# Patient Record
Sex: Female | Born: 1985 | Hispanic: Yes | Marital: Married | Smoking: Never smoker
Health system: Southern US, Community
[De-identification: ages and names within clinical notes are randomized; demographics above are authoritative.]

## PROBLEM LIST (undated history)

## (undated) DIAGNOSIS — R42 Dizziness and giddiness: Secondary | ICD-10-CM

## (undated) DIAGNOSIS — R87629 Unspecified abnormal cytological findings in specimens from vagina: Secondary | ICD-10-CM

## (undated) DIAGNOSIS — J310 Chronic rhinitis: Secondary | ICD-10-CM

## (undated) HISTORY — DX: Dizziness and giddiness: R42

---

## 2004-02-25 ENCOUNTER — Emergency Department (HOSPITAL_COMMUNITY): Admission: EM | Admit: 2004-02-25 | Discharge: 2004-02-25 | Payer: Self-pay | Admitting: *Deleted

## 2008-11-07 ENCOUNTER — Ambulatory Visit (HOSPITAL_COMMUNITY): Admission: RE | Admit: 2008-11-07 | Discharge: 2008-11-07 | Payer: Self-pay | Admitting: Family Medicine

## 2009-01-14 ENCOUNTER — Encounter: Admission: RE | Admit: 2009-01-14 | Discharge: 2009-01-14 | Payer: Self-pay | Admitting: Obstetrics and Gynecology

## 2009-06-16 ENCOUNTER — Inpatient Hospital Stay (HOSPITAL_COMMUNITY): Admission: AD | Admit: 2009-06-16 | Discharge: 2009-06-19 | Payer: Self-pay | Admitting: Obstetrics and Gynecology

## 2009-06-16 ENCOUNTER — Encounter (HOSPITAL_COMMUNITY): Payer: Self-pay | Admitting: Obstetrics and Gynecology

## 2009-06-20 ENCOUNTER — Encounter: Admission: RE | Admit: 2009-06-20 | Discharge: 2009-07-12 | Payer: Self-pay | Admitting: Obstetrics and Gynecology

## 2010-02-21 IMAGING — US US OB COMP LESS 14 WK
1 series · 14 of 28 positions shown · non-contrast
Comparison: None

CLINICAL DATA: Early pregnancy, uncertain dates

OBSTETRIC <14 WK US AND TRANSVAGINAL OB US
TECHNIQUE: Both transabdominal and transvaginal ultrasound
examinations were performed for complete evaluation of the
gestation as well as the maternal uterus, adnexal regions, and
pelvic cul-de-sac.

[Series 1: us ob comp less 14 wk · 0.18mm/px · 54 acquisitions, 14 frames shown]
[im 2/54]
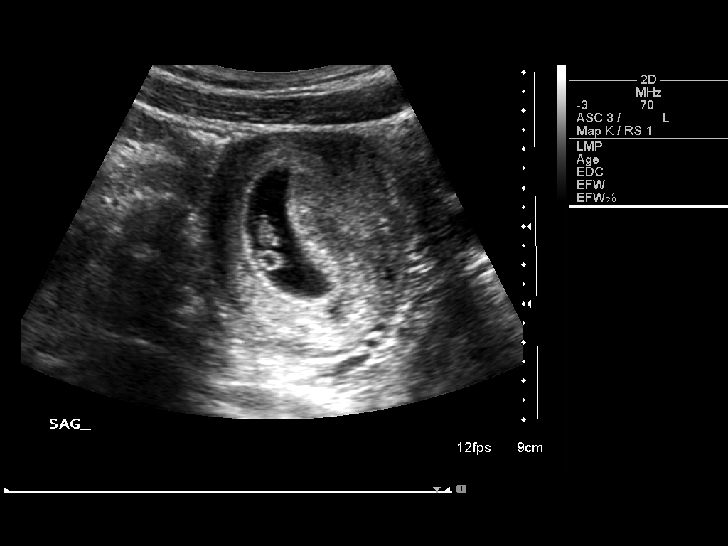
[im 6/54]
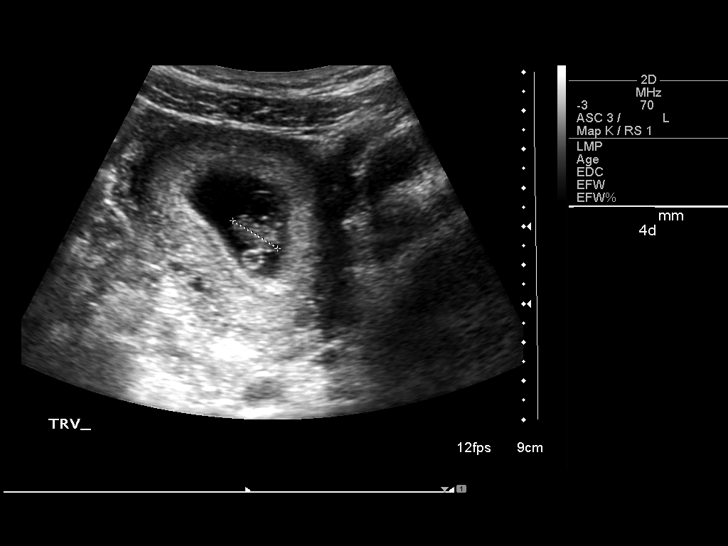
[im 10/54]
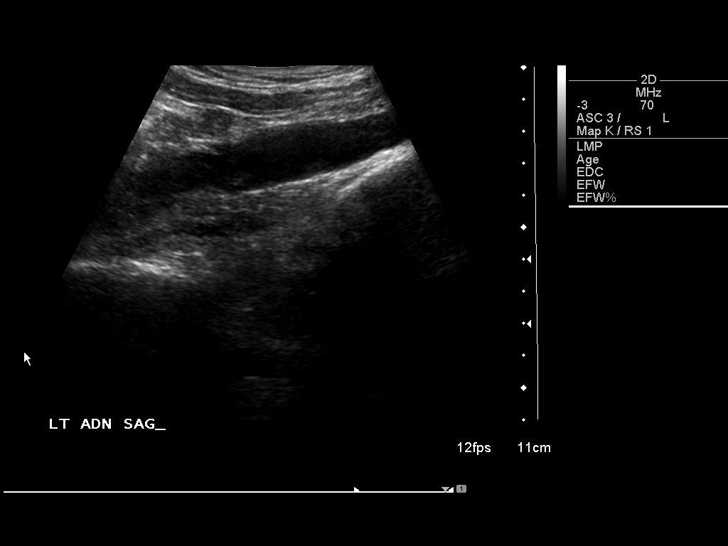
[im 14/54]
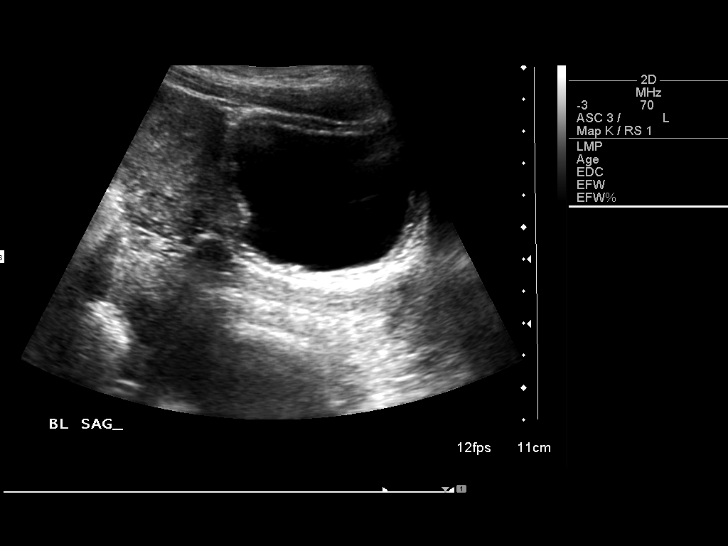
[im 18/54]
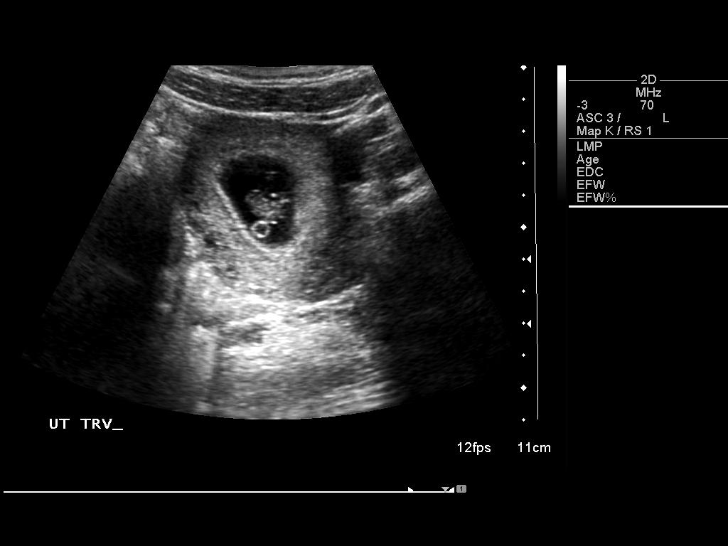
[im 22/54]
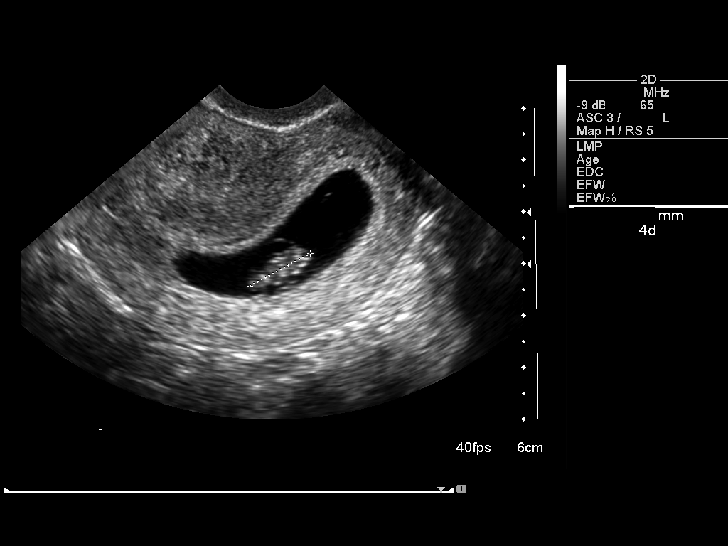
[im 26/54]
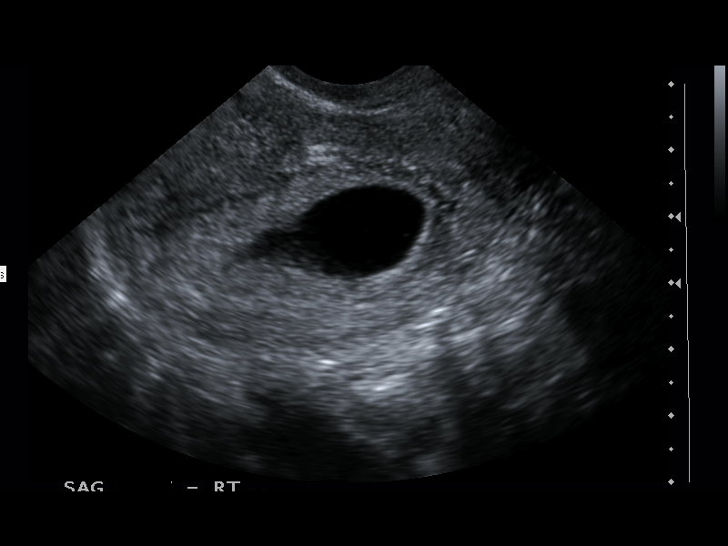
[im 30/54]
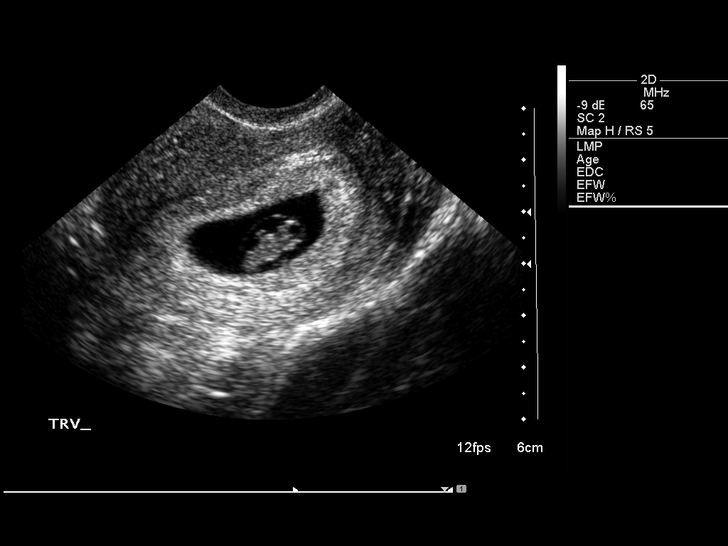
[im 34/54]
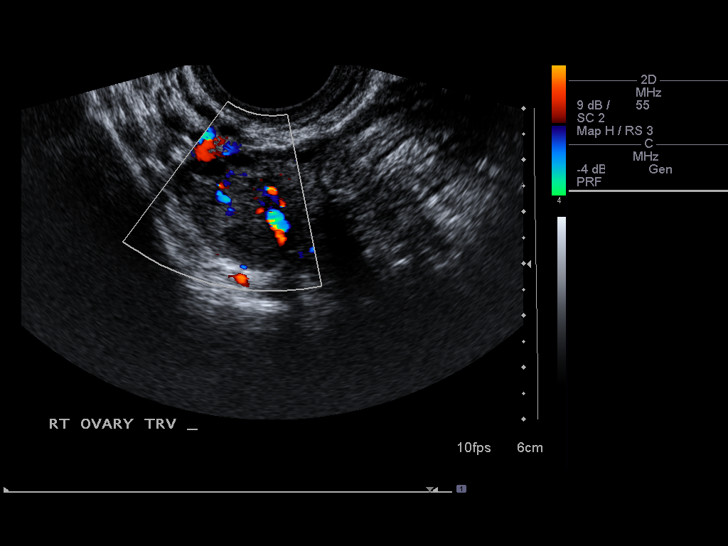
[im 38/54]
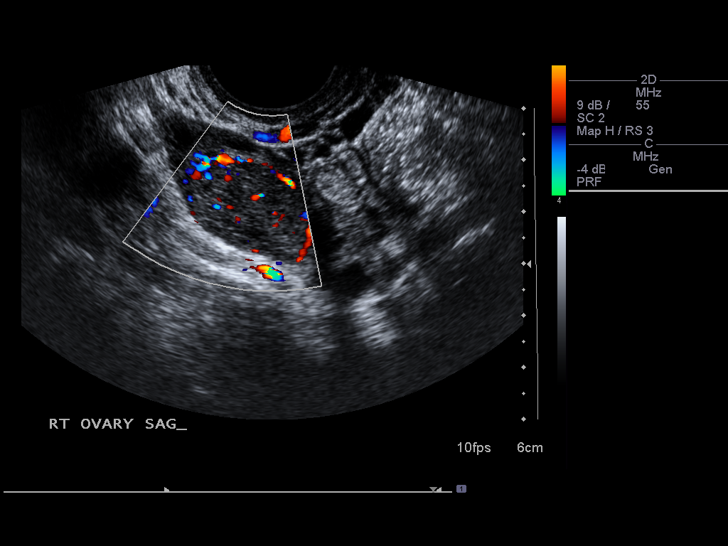
[im 42/54]
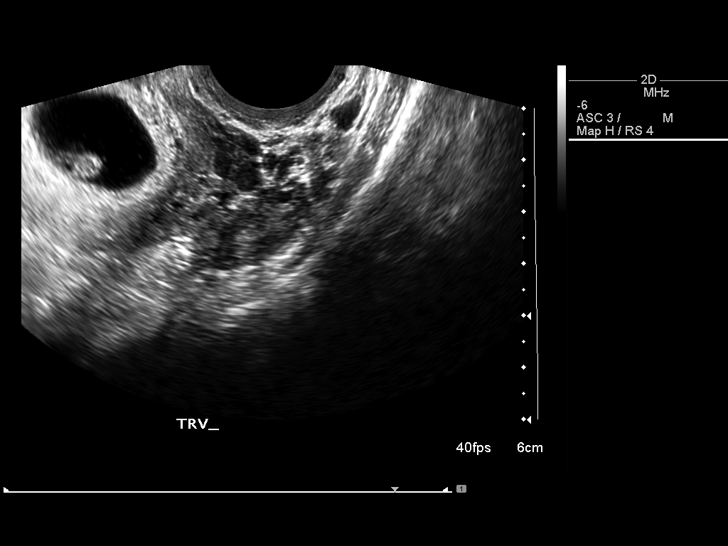
[im 46/54]
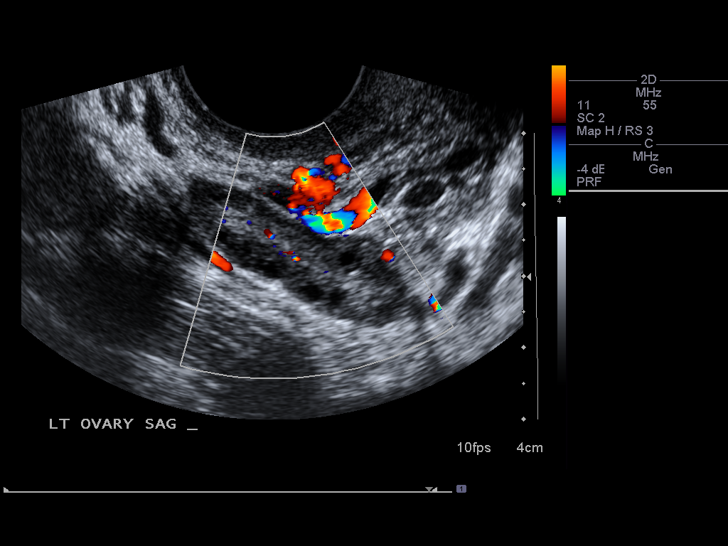
[im 50/54]
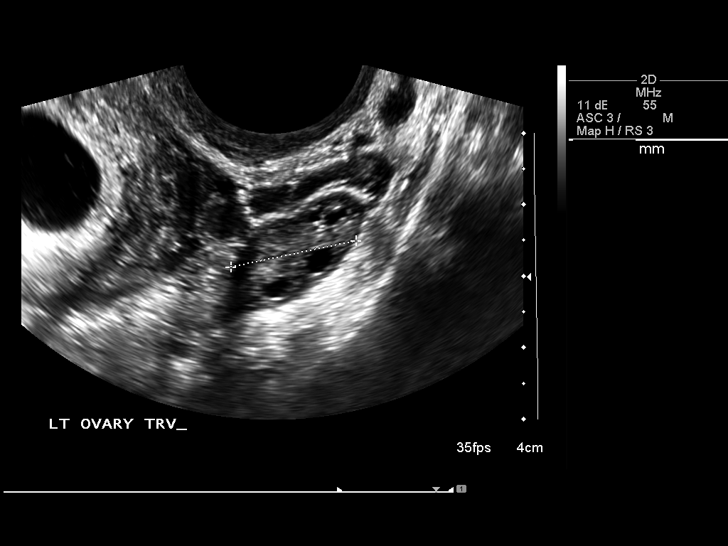
[im 54/54]
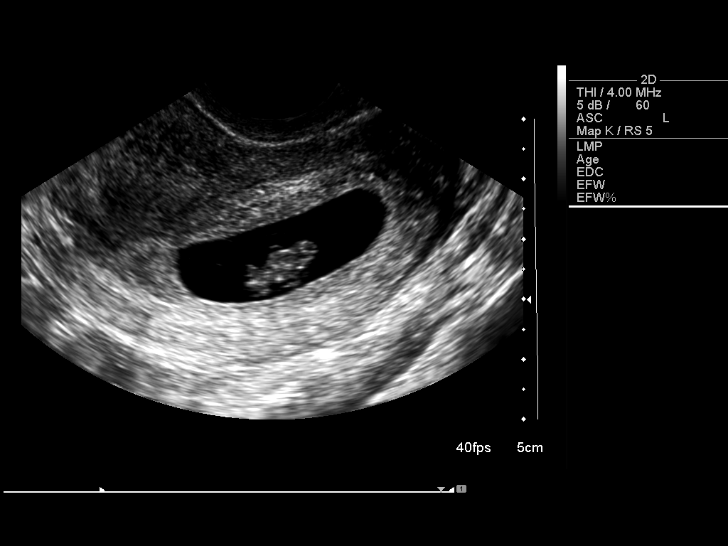

[14 of 28 positions shown; findings below may reference images not displayed]

Intrauterine gestational sac: Present
Yolk sac: Present
Embryo: Present
Cardiac Activity: Present
Heart Rate: 160 bpm

CRL: 12.7 mm           7   w  3   d             US EDC: 06/23/2009

Maternal uterus/adnexae:
No subchorionic hemorrhage.
Left ovary normal size and morphology, 3.5 x 1.4 x 1.8 cm.
Right ovary measures 3.9 x 2.0 x 2.7 cm contains a small
hemorrhagic corpus luteum.
No adnexal masses or free pelvic fluid.
IMPRESSION: Single live early intrauterine gestation at 7 weeks 3 days EGA.
No acute abnormalities.

## 2010-11-05 LAB — CBC
HCT: 27.9 % — ABNORMAL LOW (ref 36.0–46.0)
MCHC: 33.7 g/dL (ref 30.0–36.0)
Platelets: 173 10*3/uL (ref 150–400)
Platelets: 219 10*3/uL (ref 150–400)
RBC: 3.76 MIL/uL — ABNORMAL LOW (ref 3.87–5.11)
RDW: 13.9 % (ref 11.5–15.5)
WBC: 12 10*3/uL — ABNORMAL HIGH (ref 4.0–10.5)

## 2010-11-05 LAB — RPR: RPR Ser Ql: NONREACTIVE

## 2011-03-27 LAB — RPR: RPR: NONREACTIVE

## 2011-03-27 LAB — RUBELLA ANTIBODY, IGM: Rubella: IMMUNE

## 2011-03-27 LAB — HEPATITIS B SURFACE ANTIGEN: Hepatitis B Surface Ag: NEGATIVE

## 2011-09-22 ENCOUNTER — Encounter (HOSPITAL_COMMUNITY): Payer: Self-pay

## 2011-09-22 ENCOUNTER — Inpatient Hospital Stay (HOSPITAL_COMMUNITY)
Admission: AD | Admit: 2011-09-22 | Discharge: 2011-09-22 | Disposition: A | Discharge status: Home / Self Care | Payer: BC Managed Care – PPO | Source: Ambulatory Visit | Attending: Obstetrics and Gynecology | Admitting: Obstetrics and Gynecology

## 2011-09-22 DIAGNOSIS — O99891 Other specified diseases and conditions complicating pregnancy: Secondary | ICD-10-CM | POA: Insufficient documentation

## 2011-09-22 DIAGNOSIS — E860 Dehydration: Secondary | ICD-10-CM | POA: Insufficient documentation

## 2011-09-22 DIAGNOSIS — K529 Noninfective gastroenteritis and colitis, unspecified: Secondary | ICD-10-CM

## 2011-09-22 DIAGNOSIS — K5289 Other specified noninfective gastroenteritis and colitis: Secondary | ICD-10-CM | POA: Insufficient documentation

## 2011-09-22 DIAGNOSIS — O212 Late vomiting of pregnancy: Secondary | ICD-10-CM | POA: Insufficient documentation

## 2011-09-22 DIAGNOSIS — R197 Diarrhea, unspecified: Secondary | ICD-10-CM | POA: Insufficient documentation

## 2011-09-22 LAB — URINE MICROSCOPIC-ADD ON

## 2011-09-22 LAB — URINALYSIS, ROUTINE W REFLEX MICROSCOPIC
Nitrite: NEGATIVE
Specific Gravity, Urine: >1.03 — ABNORMAL HIGH (ref 1.005–1.030)
Urobilinogen, UA: 0.2 mg/dL (ref 0.0–1.0)
pH: 6 (ref 5.0–8.0)

## 2011-09-22 MED ORDER — ONDANSETRON 4 MG PO TBDP: 4.0000 mg | ORAL_TABLET | Freq: Once | ORAL | Status: DC

## 2011-09-22 MED ORDER — ONDANSETRON HCL 4 MG/2ML IJ SOLN: 4.0000 mg | Freq: Once | INTRAMUSCULAR | Status: DC

## 2011-09-22 MED ORDER — DEXTROSE 5 % IN LACTATED RINGERS IV BOLUS
1000.0000 mL | Freq: Once | INTRAVENOUS | Status: AC
  Administered 2011-09-22: 1000 mL via INTRAVENOUS

## 2011-09-22 NOTE — ED Provider Notes (Signed)
History     Chief Complaint  Patient presents with  . Diarrhea  . Emesis   HPI This is a 26 year old G2 P1 001 at 37 weeks and 2 days who is a patient of [facility] who comes the MAU with nausea, vomiting, diarrhea that started this morning. She's had 2 episodes of vomiting and diarrhea. She does have some abdominal discomfort, mainly located in her right upper quadrant. Emesis consists of stomach contents and mild bile. She does have occasional intermittent contractions, but is not uncomfortable with these contractions. She denies decreased fetal activity, vaginal bleeding, vaginal discharge. She also denies fevers, myalgias, arthralgias, headaches. She does have sick contacts the forms of her husband and her sister who were sent with the same symptoms today's ago. They're both resolved at this point.    OB History    Grav Para Term Preterm Abortions TAB SAB Ect Mult Living   2 1 1  0 0 0 0 0 0 1      Past Medical History  Diagnosis Date  . No pertinent past medical history     Past Surgical History  Procedure Date  . Cesarean section     Family History  Problem Relation Age of Onset  . Anesthesia problems Neg Hx     History  Substance Use Topics  . Smoking status: Never Smoker   . Smokeless tobacco: Not on file  . Alcohol Use: No    Allergies: No Known Allergies  Prescriptions prior to admission  Medication Sig Dispense Refill  . guaifenesin (ROBITUSSIN) 100 MG/5ML syrup Take 200 mg by mouth 3 (three) times daily as needed. For cough.      . Prenatal Vit-Fe Fumarate-FA (PRENATAL MULTIVITAMIN) TABS Take 1 tablet by mouth daily.        ROS Physical Exam   Blood pressure 113/72, pulse 93, temperature 99 F (37.2 C), temperature source Oral, resp. rate 16, height 5\' 7"  (1.702 m), weight 84.278 kg (185 lb 12.8 oz), SpO2 99.00%.  Physical Exam  Constitutional: She is oriented to person, place, and time. She appears well-developed and well-nourished.    Respiratory: Effort normal.  GI: Soft. Bowel sounds are normal. She exhibits no distension and no mass. There is tenderness. There is no rebound and no guarding.  Neurological: She is alert and oriented to person, place, and time.  Skin: Skin is warm and dry.  Psychiatric: She has a normal mood and affect. Her behavior is normal. Judgment and thought content normal.   NST shows category 1 tracing with a baseline rate of 140. Contractions are every 5-10 minutes. Urinalysis shows: Specific gravity of 1.030 with ketones of 15.  MAU Course  Procedures  MDM Patient given 1 L bolus of D5 LR.  She was offered antiemetics, but they were declined.  After observation for 2 hours, patient related that she was feeling somewhat improved.  Contractions spaced out to approximately every 15 minutes.  Assessment and Plan  1.  Gastroenteritis 2.  Dehydration 3.  IUP at 37 weeks 2 days.  Patient discharged to home.  F/u tomorrow at [facility].  BRAT diet handout given.  Encouraged sips of fluids to keep hydrated.    Jeneen Doutt JEHIEL 09/22/2011, 10:44 AM

## 2011-09-22 NOTE — Progress Notes (Signed)
Patient states she started having nausea, vomiting and diarrhea this am. Has been having upper abdominal cramping. Reports no bleeding or leaking and good fetal movement.

## 2011-09-22 NOTE — Progress Notes (Signed)
Pt in c/o nausea, vomiting, diarrhea since this morning.  Reports vomiting x2 episodes, and diarrhea x2 days.  States daughter and husband have both had the same thing recently.  Reports generalized abdominal pain.  Denies any bleeding or lof.  + FM.

## 2011-09-22 NOTE — Discharge Instructions (Signed)
B.R.A.T. Diet    Your doctor has recommended the B.R.A.T. diet for you or your child until the condition improves. This is often used to help control diarrhea and vomiting symptoms. If you or your child can tolerate clear liquids, you may have:  · Bananas.   · Rice.   · Applesauce.   · Toast (and other simple starches such as crackers, potatoes, noodles).   Be sure to avoid dairy products, meats, and fatty foods until symptoms are better. Fruit juices such as apple, grape, and prune juice can make diarrhea worse. Avoid these. Continue this diet for 2 days or as instructed by your caregiver.  Document Released: 07/20/2005 Document Revised: 04/01/2011 Document Reviewed: 01/06/2007  [employer]® Patient Information ©2012 [employer].

## 2011-09-29 ENCOUNTER — Encounter (HOSPITAL_COMMUNITY): Payer: Self-pay

## 2011-09-30 ENCOUNTER — Encounter (HOSPITAL_COMMUNITY)
Admission: RE | Admit: 2011-09-30 | Discharge: 2011-09-30 | Disposition: A | Discharge status: Home / Self Care | Payer: BC Managed Care – PPO | Source: Ambulatory Visit | Attending: Obstetrics and Gynecology | Admitting: Obstetrics and Gynecology

## 2011-09-30 ENCOUNTER — Encounter (HOSPITAL_COMMUNITY): Payer: Self-pay

## 2011-09-30 HISTORY — DX: Chronic rhinitis: J31.0

## 2011-09-30 LAB — RPR: RPR Ser Ql: NONREACTIVE

## 2011-09-30 LAB — CBC
Hemoglobin: 11.2 g/dL — ABNORMAL LOW (ref 12.0–15.0)
Platelets: 244 10*3/uL (ref 150–400)
RBC: 3.99 MIL/uL (ref 3.87–5.11)

## 2011-09-30 LAB — SURGICAL PCR SCREEN: MRSA, PCR: NEGATIVE

## 2011-09-30 NOTE — Patient Instructions (Addendum)
   Your procedure is scheduled OZ:HYQMVH March 4th  Enter through the Main Entrance of [facility] at: 11:30am Pick up the phone at the desk and dial [phone] and inform us of your arrival.  Please call this number if you have any problems the morning of surgery: [phone]  Remember: Do not eat food after midnight:Sunday Do not drink clear liquids after:Monday at 9am Take these medicines the morning of surgery with a SIP OF WATER:none  Do not wear jewelry, make-up, or FINGER nail polish Do not wear lotions, powders, perfumes or deodorant. Do not shave 48 hours prior to surgery. Do not bring valuables to the hospital.  Leave suitcase in the car. After Surgery it may be brought to your room. For patients being admitted to the hospital, checkout time is 11:00am the day of discharge.     Remember to use your hibiclens as instructed.Please shower with 1/2 bottle the evening before your surgery and the other 1/2 bottle the morning of surgery.

## 2011-10-04 ENCOUNTER — Encounter (HOSPITAL_COMMUNITY): Payer: Self-pay

## 2011-10-04 ENCOUNTER — Inpatient Hospital Stay (HOSPITAL_COMMUNITY): Payer: BC Managed Care – PPO | Admitting: Anesthesiology

## 2011-10-04 ENCOUNTER — Inpatient Hospital Stay (HOSPITAL_COMMUNITY)
Admission: AD | Admit: 2011-10-04 | Discharge: 2011-10-06 | DRG: 371 | Disposition: A | Discharge status: Home / Self Care | Payer: BC Managed Care – PPO | Source: Ambulatory Visit | Attending: Obstetrics and Gynecology | Admitting: Obstetrics and Gynecology

## 2011-10-04 ENCOUNTER — Encounter (HOSPITAL_COMMUNITY)
Admission: AD | Disposition: A | Discharge status: Home / Self Care | Payer: Self-pay | Source: Ambulatory Visit | Attending: Obstetrics and Gynecology

## 2011-10-04 ENCOUNTER — Encounter (HOSPITAL_COMMUNITY): Payer: Self-pay | Admitting: Anesthesiology

## 2011-10-04 ENCOUNTER — Encounter (HOSPITAL_COMMUNITY): Payer: Self-pay | Admitting: *Deleted

## 2011-10-04 DIAGNOSIS — O34219 Maternal care for unspecified type scar from previous cesarean delivery: Principal | ICD-10-CM | POA: Diagnosis present

## 2011-10-04 DIAGNOSIS — Z01818 Encounter for other preprocedural examination: Secondary | ICD-10-CM

## 2011-10-04 DIAGNOSIS — Z01812 Encounter for preprocedural laboratory examination: Secondary | ICD-10-CM

## 2011-10-04 LAB — CBC
MCH: 28.6 pg (ref 26.0–34.0)
MCV: 87.7 fL (ref 78.0–100.0)
Platelets: 260 10*3/uL (ref 150–400)
RBC: 3.98 MIL/uL (ref 3.87–5.11)
RDW: 14.3 % (ref 11.5–15.5)
WBC: 11.4 10*3/uL — ABNORMAL HIGH (ref 4.0–10.5)

## 2011-10-04 LAB — RPR: RPR Ser Ql: NONREACTIVE

## 2011-10-04 SURGERY — Surgical Case
Anesthesia: Spinal | Site: Abdomen | Wound class: Clean Contaminated

## 2011-10-04 MED ORDER — SENNOSIDES-DOCUSATE SODIUM 8.6-50 MG PO TABS
2.0000 | ORAL_TABLET | Freq: Every day | ORAL | Status: DC
  Administered 2011-10-04 – 2011-10-05 (×2): 2 via ORAL

## 2011-10-04 MED ORDER — CITRIC ACID-SODIUM CITRATE 334-500 MG/5ML PO SOLN: 30.0000 mL | Freq: Once | ORAL | Status: DC

## 2011-10-04 MED ORDER — CITRIC ACID-SODIUM CITRATE 334-500 MG/5ML PO SOLN
30.0000 mL | Freq: Once | ORAL | Status: AC
  Administered 2011-10-04: 30 mL via ORAL

## 2011-10-04 MED ORDER — NALBUPHINE HCL 10 MG/ML IJ SOLN
5.0000 mg | INTRAMUSCULAR | Status: DC | PRN
  Filled 2011-10-04: qty 1

## 2011-10-04 MED ORDER — ONDANSETRON HCL 4 MG PO TABS: 4.0000 mg | ORAL_TABLET | ORAL | Status: DC | PRN

## 2011-10-04 MED ORDER — FENTANYL CITRATE 0.05 MG/ML IJ SOLN: 25.0000 ug | INTRAMUSCULAR | Status: DC | PRN

## 2011-10-04 MED ORDER — ONDANSETRON HCL 4 MG/2ML IJ SOLN
INTRAMUSCULAR | Status: AC
  Filled 2011-10-04: qty 2

## 2011-10-04 MED ORDER — SCOPOLAMINE 1 MG/3DAYS TD PT72
1.0000 | MEDICATED_PATCH | Freq: Once | TRANSDERMAL | Status: DC
  Administered 2011-10-04: 1.5 mg via TRANSDERMAL

## 2011-10-04 MED ORDER — OXYTOCIN 10 UNIT/ML IJ SOLN
INTRAMUSCULAR | Status: AC
  Filled 2011-10-04: qty 4

## 2011-10-04 MED ORDER — MENTHOL 3 MG MT LOZG: 1.0000 | LOZENGE | OROMUCOSAL | Status: DC | PRN

## 2011-10-04 MED ORDER — DIPHENHYDRAMINE HCL 25 MG PO CAPS: 25.0000 mg | ORAL_CAPSULE | ORAL | Status: DC | PRN

## 2011-10-04 MED ORDER — FENTANYL CITRATE 0.05 MG/ML IJ SOLN
INTRAMUSCULAR | Status: DC | PRN
  Administered 2011-10-04: 25 ug via INTRATHECAL

## 2011-10-04 MED ORDER — DIBUCAINE 1 % RE OINT: 1.0000 "application " | TOPICAL_OINTMENT | RECTAL | Status: DC | PRN

## 2011-10-04 MED ORDER — MEPERIDINE HCL 25 MG/ML IJ SOLN: 6.2500 mg | INTRAMUSCULAR | Status: DC | PRN

## 2011-10-04 MED ORDER — KETOROLAC TROMETHAMINE 30 MG/ML IJ SOLN
30.0000 mg | Freq: Four times a day (QID) | INTRAMUSCULAR | Status: AC | PRN
  Administered 2011-10-04: 30 mg via INTRAVENOUS

## 2011-10-04 MED ORDER — FAMOTIDINE IN NACL 20-0.9 MG/50ML-% IV SOLN
20.0000 mg | Freq: Once | INTRAVENOUS | Status: AC
  Administered 2011-10-04: 20 mg via INTRAVENOUS

## 2011-10-04 MED ORDER — ONDANSETRON HCL 4 MG/2ML IJ SOLN: 4.0000 mg | INTRAMUSCULAR | Status: DC | PRN

## 2011-10-04 MED ORDER — OXYTOCIN 10 UNIT/ML IJ SOLN
20.0000 [IU] | INTRAVENOUS | Status: DC | PRN
  Administered 2011-10-04: 20 [IU] via INTRAVENOUS

## 2011-10-04 MED ORDER — METOCLOPRAMIDE HCL 5 MG/ML IJ SOLN: 10.0000 mg | Freq: Once | INTRAMUSCULAR | Status: DC | PRN

## 2011-10-04 MED ORDER — ONDANSETRON HCL 4 MG/2ML IJ SOLN: 4.0000 mg | Freq: Three times a day (TID) | INTRAMUSCULAR | Status: DC | PRN

## 2011-10-04 MED ORDER — SODIUM CHLORIDE 0.9 % IV SOLN
1.0000 ug/kg/h | INTRAVENOUS | Status: DC | PRN
  Filled 2011-10-04: qty 2.5

## 2011-10-04 MED ORDER — WITCH HAZEL-GLYCERIN EX PADS: 1.0000 "application " | MEDICATED_PAD | CUTANEOUS | Status: DC | PRN

## 2011-10-04 MED ORDER — METOCLOPRAMIDE HCL 5 MG/ML IJ SOLN: 10.0000 mg | Freq: Three times a day (TID) | INTRAMUSCULAR | Status: DC | PRN

## 2011-10-04 MED ORDER — LACTATED RINGERS IV SOLN
INTRAVENOUS | Status: DC | PRN
  Administered 2011-10-04 (×3): via INTRAVENOUS

## 2011-10-04 MED ORDER — FENTANYL CITRATE 0.05 MG/ML IJ SOLN
INTRAMUSCULAR | Status: AC
  Filled 2011-10-04: qty 2

## 2011-10-04 MED ORDER — BUPIVACAINE IN DEXTROSE 0.75-8.25 % IT SOLN
INTRATHECAL | Status: DC | PRN
  Administered 2011-10-04: 1.8 mL via INTRATHECAL

## 2011-10-04 MED ORDER — EPHEDRINE 5 MG/ML INJ
INTRAVENOUS | Status: AC
  Filled 2011-10-04: qty 10

## 2011-10-04 MED ORDER — LACTATED RINGERS IV SOLN
INTRAVENOUS | Status: DC
  Administered 2011-10-04: 03:00:00 via INTRAVENOUS

## 2011-10-04 MED ORDER — PRENATAL MULTIVITAMIN CH
1.0000 | ORAL_TABLET | Freq: Every day | ORAL | Status: DC
  Administered 2011-10-06: 1 via ORAL
  Filled 2011-10-04 (×2): qty 1

## 2011-10-04 MED ORDER — MEPERIDINE HCL 25 MG/ML IJ SOLN
INTRAMUSCULAR | Status: DC | PRN
  Administered 2011-10-04: 25 mg via INTRAVENOUS

## 2011-10-04 MED ORDER — CEFAZOLIN SODIUM 1-5 GM-% IV SOLN
1.0000 g | Freq: Once | INTRAVENOUS | Status: DC
Start: 2011-10-04 — End: 2011-10-04
  Administered 2011-10-04: 1 g via INTRAVENOUS
  Filled 2011-10-04: qty 50

## 2011-10-04 MED ORDER — LACTATED RINGERS IV SOLN: INTRAVENOUS | Status: DC

## 2011-10-04 MED ORDER — SODIUM CHLORIDE 0.9 % IJ SOLN: 3.0000 mL | INTRAMUSCULAR | Status: DC | PRN

## 2011-10-04 MED ORDER — OXYTOCIN 10 UNIT/ML IJ SOLN
INTRAMUSCULAR | Status: AC
  Filled 2011-10-04: qty 2

## 2011-10-04 MED ORDER — MORPHINE SULFATE (PF) 0.5 MG/ML IJ SOLN
INTRAMUSCULAR | Status: DC | PRN
  Administered 2011-10-04: .15 mg via INTRATHECAL

## 2011-10-04 MED ORDER — LACTATED RINGERS IV SOLN
INTRAVENOUS | Status: DC
  Administered 2011-10-04: 19:00:00 via INTRAVENOUS

## 2011-10-04 MED ORDER — OXYTOCIN 20 UNITS IN LACTATED RINGERS INFUSION - SIMPLE: 125.0000 mL/h | INTRAVENOUS | Status: AC

## 2011-10-04 MED ORDER — ZOLPIDEM TARTRATE 5 MG PO TABS: 5.0000 mg | ORAL_TABLET | Freq: Every evening | ORAL | Status: DC | PRN

## 2011-10-04 MED ORDER — COMPLETENATE 29-1 MG PO CHEW
1.0000 | CHEWABLE_TABLET | Freq: Every day | ORAL | Status: DC
  Administered 2011-10-04 – 2011-10-05 (×2): 1 via ORAL
  Filled 2011-10-04 (×4): qty 1

## 2011-10-04 MED ORDER — KETOROLAC TROMETHAMINE 30 MG/ML IJ SOLN: 30.0000 mg | Freq: Four times a day (QID) | INTRAMUSCULAR | Status: AC | PRN

## 2011-10-04 MED ORDER — IBUPROFEN 100 MG/5ML PO SUSP
600.0000 mg | Freq: Four times a day (QID) | ORAL | Status: DC
  Administered 2011-10-04 – 2011-10-06 (×9): 600 mg via ORAL
  Filled 2011-10-04 (×13): qty 30

## 2011-10-04 MED ORDER — KETOROLAC TROMETHAMINE 30 MG/ML IJ SOLN
INTRAMUSCULAR | Status: AC
  Administered 2011-10-04: 30 mg via INTRAVENOUS
  Filled 2011-10-04: qty 1

## 2011-10-04 MED ORDER — TETANUS-DIPHTH-ACELL PERTUSSIS 5-2.5-18.5 LF-MCG/0.5 IM SUSP: 0.5000 mL | Freq: Once | INTRAMUSCULAR | Status: DC

## 2011-10-04 MED ORDER — DIPHENHYDRAMINE HCL 50 MG/ML IJ SOLN: 12.5000 mg | INTRAMUSCULAR | Status: DC | PRN

## 2011-10-04 MED ORDER — EPHEDRINE SULFATE 50 MG/ML IJ SOLN
INTRAMUSCULAR | Status: DC | PRN
  Administered 2011-10-04 (×5): 10 mg via INTRAVENOUS

## 2011-10-04 MED ORDER — MORPHINE SULFATE (PF) 0.5 MG/ML IJ SOLN
INTRAMUSCULAR | Status: DC | PRN
  Administered 2011-10-04: 1 mg via EPIDURAL
  Administered 2011-10-04: 1.85 mg via INTRAVENOUS
  Administered 2011-10-04 (×2): 1 mg via INTRAVENOUS

## 2011-10-04 MED ORDER — FAMOTIDINE IN NACL 20-0.9 MG/50ML-% IV SOLN
INTRAVENOUS | Status: AC
  Filled 2011-10-04: qty 50

## 2011-10-04 MED ORDER — MORPHINE SULFATE 0.5 MG/ML IJ SOLN
INTRAMUSCULAR | Status: AC
  Filled 2011-10-04: qty 10

## 2011-10-04 MED ORDER — CITRIC ACID-SODIUM CITRATE 334-500 MG/5ML PO SOLN
ORAL | Status: AC
  Filled 2011-10-04: qty 15

## 2011-10-04 MED ORDER — IBUPROFEN 600 MG PO TABS
600.0000 mg | ORAL_TABLET | Freq: Four times a day (QID) | ORAL | Status: DC | PRN
  Filled 2011-10-04 (×2): qty 1

## 2011-10-04 MED ORDER — CEFAZOLIN SODIUM 1-5 GM-% IV SOLN: 1.0000 g | INTRAVENOUS | Status: DC

## 2011-10-04 MED ORDER — DIPHENHYDRAMINE HCL 50 MG/ML IJ SOLN: 25.0000 mg | INTRAMUSCULAR | Status: DC | PRN

## 2011-10-04 MED ORDER — CEFAZOLIN SODIUM 1-5 GM-% IV SOLN
INTRAVENOUS | Status: DC | PRN
  Administered 2011-10-04: 1 g via INTRAVENOUS

## 2011-10-04 MED ORDER — ONDANSETRON HCL 4 MG/2ML IJ SOLN
INTRAMUSCULAR | Status: DC | PRN
  Administered 2011-10-04: 4 mg via INTRAVENOUS

## 2011-10-04 MED ORDER — NALOXONE HCL 0.4 MG/ML IJ SOLN: 0.4000 mg | INTRAMUSCULAR | Status: DC | PRN

## 2011-10-04 MED ORDER — LANOLIN HYDROUS EX OINT: 1.0000 "application " | TOPICAL_OINTMENT | CUTANEOUS | Status: DC | PRN

## 2011-10-04 MED ORDER — OXYCODONE-ACETAMINOPHEN 5-325 MG PO TABS: 1.0000 | ORAL_TABLET | ORAL | Status: DC | PRN

## 2011-10-04 MED ORDER — FENTANYL CITRATE 0.05 MG/ML IJ SOLN
INTRAMUSCULAR | Status: DC | PRN
  Administered 2011-10-04: 75 ug via INTRAVENOUS

## 2011-10-04 MED ORDER — DIPHENHYDRAMINE HCL 25 MG PO CAPS: 25.0000 mg | ORAL_CAPSULE | Freq: Four times a day (QID) | ORAL | Status: DC | PRN

## 2011-10-04 MED ORDER — MEPERIDINE HCL 25 MG/ML IJ SOLN
INTRAMUSCULAR | Status: AC
  Filled 2011-10-04: qty 1

## 2011-10-04 MED ORDER — SCOPOLAMINE 1 MG/3DAYS TD PT72
MEDICATED_PATCH | TRANSDERMAL | Status: AC
  Administered 2011-10-04: 1.5 mg via TRANSDERMAL
  Filled 2011-10-04: qty 1

## 2011-10-04 MED ORDER — 0.9 % SODIUM CHLORIDE (POUR BTL) OPTIME
TOPICAL | Status: DC | PRN
  Administered 2011-10-04: 400 mL
  Administered 2011-10-04: 200 mL

## 2011-10-04 MED ORDER — SIMETHICONE 80 MG PO CHEW: 80.0000 mg | CHEWABLE_TABLET | ORAL | Status: DC | PRN

## 2011-10-04 MED ORDER — PHENYLEPHRINE 40 MCG/ML (10ML) SYRINGE FOR IV PUSH (FOR BLOOD PRESSURE SUPPORT)
PREFILLED_SYRINGE | INTRAVENOUS | Status: AC
  Filled 2011-10-04: qty 10

## 2011-10-04 MED ORDER — IBUPROFEN 600 MG PO TABS: 600.0000 mg | ORAL_TABLET | Freq: Four times a day (QID) | ORAL | Status: DC

## 2011-10-04 MED ORDER — SIMETHICONE 80 MG PO CHEW
80.0000 mg | CHEWABLE_TABLET | Freq: Three times a day (TID) | ORAL | Status: DC
  Administered 2011-10-04 – 2011-10-06 (×8): 80 mg via ORAL

## 2011-10-04 MED ORDER — PHENYLEPHRINE HCL 10 MG/ML IJ SOLN
INTRAMUSCULAR | Status: DC | PRN
  Administered 2011-10-04: 80 ug via INTRAVENOUS
  Administered 2011-10-04: 40 ug via INTRAVENOUS
  Administered 2011-10-04: 80 ug via INTRAVENOUS
  Administered 2011-10-04: 120 ug via INTRAVENOUS

## 2011-10-04 SURGICAL SUPPLY — 30 items
APL SKNCLS STERI-STRIP NONHPOA (GAUZE/BANDAGES/DRESSINGS) ×1
BENZOIN TINCTURE PRP APPL 2/3 (GAUZE/BANDAGES/DRESSINGS) ×1 IMPLANT
CLOTH BEACON ORANGE TIMEOUT ST (SAFETY) ×2 IMPLANT
CONTAINER PREFILL 10% NBF 15ML (MISCELLANEOUS) IMPLANT
DRESSING TELFA 8X3 (GAUZE/BANDAGES/DRESSINGS) ×1 IMPLANT
DRSG PAD ABDOMINAL 8X10 ST (GAUZE/BANDAGES/DRESSINGS) ×1 IMPLANT
ELECT REM PT RETURN 9FT ADLT (ELECTROSURGICAL) ×2
ELECTRODE REM PT RTRN 9FT ADLT (ELECTROSURGICAL) ×1 IMPLANT
EXTRACTOR VACUUM M CUP 4 TUBE (SUCTIONS) IMPLANT
GAUZE SPONGE 4X4 12PLY STRL LF (GAUZE/BANDAGES/DRESSINGS) ×2 IMPLANT
GLOVE BIO SURGEON STRL SZ8 (GLOVE) ×4 IMPLANT
KIT ABG SYR 3ML LUER SLIP (SYRINGE) ×2 IMPLANT
NDL HYPO 25X5/8 SAFETYGLIDE (NEEDLE) ×1 IMPLANT
NEEDLE HYPO 25X5/8 SAFETYGLIDE (NEEDLE) ×2 IMPLANT
NS IRRIG 1000ML POUR BTL (IV SOLUTION) ×3 IMPLANT
PACK C SECTION WH (CUSTOM PROCEDURE TRAY) ×2 IMPLANT
PAD ABD 7.5X8 STRL (GAUZE/BANDAGES/DRESSINGS) ×1 IMPLANT
SLEEVE SCD COMPRESS KNEE MED (MISCELLANEOUS) ×1 IMPLANT
STAPLER VISISTAT 35W (STAPLE) IMPLANT
STRIP CLOSURE SKIN 1/4X4 (GAUZE/BANDAGES/DRESSINGS) ×2 IMPLANT
SUT MNCRL 0 VIOLET CTX 36 (SUTURE) ×4 IMPLANT
SUT MONOCRYL 0 CTX 36 (SUTURE) ×4
SUT PDS AB 0 CTX 60 (SUTURE) ×2 IMPLANT
SUT PLAIN 0 NONE (SUTURE) IMPLANT
SUT PLAIN 2 0 XLH (SUTURE) ×1 IMPLANT
SUT VIC AB 4-0 KS 27 (SUTURE) ×1 IMPLANT
TAPE CLOTH SURG 4X10 WHT LF (GAUZE/BANDAGES/DRESSINGS) ×1 IMPLANT
TOWEL OR 17X24 6PK STRL BLUE (TOWEL DISPOSABLE) ×4 IMPLANT
TRAY FOLEY CATH 14FR (SET/KITS/TRAYS/PACK) ×2 IMPLANT
WATER STERILE IRR 1000ML POUR (IV SOLUTION) ×2 IMPLANT

## 2011-10-04 NOTE — Brief Op Note (Signed)
10/04/2011  4:48 AM  PATIENT:  Alexandria Cherry  26 y.o. female  PRE-OPERATIVE DIAGNOSIS:  previous cesarean section, ruptured membranes  POST-OPERATIVE DIAGNOSIS:  previous cesarean section, ruptured membranes  PROCEDURE:  Procedure(s) (LRB): CESAREAN SECTION (N/A)  SURGEON:  Surgeon(s) and Role:    * Leslie Andrea, MD - Primary  PHYSICIAN ASSISTANT:   ASSISTANTS: none   ANESTHESIA:   spinal  EBL:  Total I/O In: 2000 [I.V.:2000] Out: 800 [Urine:100; Blood:700]  BLOOD ADMINISTERED:none  DRAINS: Urinary Catheter (Foley)   LOCAL MEDICATIONS USED:  NONE  SPECIMEN:  No Specimen  DISPOSITION OF SPECIMEN:  N/A  COUNTS:  YES  TOURNIQUET:  * No tourniquets in log *  DICTATION: .Other Dictation: Dictation Number [id number]  PLAN OF CARE: Admit to inpatient   PATIENT DISPOSITION:  PACU - hemodynamically stable.   Delay start of Pharmacological VTE agent (>24hrs) due to surgical blood loss or risk of bleeding: not applicable

## 2011-10-04 NOTE — Anesthesia Postprocedure Evaluation (Signed)
  Anesthesia Post-op Note  Patient: Alexandria Cherry  Procedure(s) Performed: Procedure(s) (LRB): CESAREAN SECTION (N/A)  Patient Location: PACU  Anesthesia Type: Spinal  Level of Consciousness: awake, alert  and oriented  Airway and Oxygen Therapy: Patient Spontanous Breathing  Post-op Pain: none  Post-op Assessment: Post-op Vital signs reviewed, Patient's Cardiovascular Status Stable, Respiratory Function Stable, Patent Airway, No signs of Nausea or vomiting, Pain level controlled, No headache and No backache  Post-op Vital Signs: Reviewed and stable  Complications: No apparent anesthesia complications

## 2011-10-04 NOTE — Consult Note (Signed)
Requested to attend term gestation delivery by repeat C/S following onset spontaneous labor this AM; originally scheduled for surgery on 10/05/11.  At birth infant in vertex and was manually extracted with spontaneous cries and active tone observed. Given tactile stimulation with drying and bulb suction to naso/oropharynx. No dysmorphic features. Voided under radiant warmer.  Shown to parents then care transferred to assigned pediatrician and L/D RN in OR to oversee skin to skin bonding.   Dagoberto Ligas MD [facility]

## 2011-10-04 NOTE — H&P (Signed)
Alexandria Cherry is a 26 y.o. female presenting for SROM and UCs since about 1 am.  No CNS change, epigastric pain. Maternal Medical History:  Reason for admission: Reason for admission: contractions.  Contractions: Onset was 3-5 hours ago.    Fetal activity: Perceived fetal activity is normal.      OB History    Grav Para Term Preterm Abortions TAB SAB Ect Mult Living   2 1 1  0 0 0 0 0 0 1     Past Medical History  Diagnosis Date  . No pertinent past medical history   . Rhinitis recent onset 09/30/11   Past Surgical History  Procedure Date  . Cesarean section 06/2009   Family History: family history is negative for Anesthesia problems. Social History:  reports that she has never smoked. She does not have any smokeless tobacco history on file. She reports that she does not drink alcohol or use illicit drugs.  Review of Systems  Eyes: Negative for blurred vision.  Gastrointestinal: Negative for abdominal pain.  Neurological: Negative for headaches.    Dilation: 3 Effacement (%): 90 Station: -1 Exam by:: Peace, rn Blood pressure 131/83, pulse 80, temperature 98.6 F (37 C), temperature source Oral, resp. rate 18, height 5\' 7"  (1.702 m), weight 86.183 kg (190 lb), last menstrual period 01/04/2011. Maternal Exam:  Uterine Assessment: Contraction strength is firm.  Contraction frequency is regular.      Fetal Exam Fetal Monitor Review: Pattern: accelerations present.       Physical Exam  Cardiovascular: Normal rate and regular rhythm.   Respiratory: Effort normal and breath sounds normal.  GI: There is no tenderness.  Neurological: She has normal reflexes.    Prenatal labs: ABO, Rh: O/Positive/-- (08/24 0000) Antibody: Negative (08/24 0000) Rubella: Immune (08/24 0000) RPR: NON REACTIVE (02/27 1006)  HBsAg: Negative (08/24 0000)  HIV: Non-reactive (08/24 0000)  GBS:     Assessment/Plan: 25 yo G2P1 at 62 0/7 weeks with SROM and labor requests repeat cesarean  section. D/W pt/husband surgery and risks-infection, organ damage, bleeding/transfusion-HIV/HEP, DVT/PE, pneumonia.  Pt states she does not want transfusion of blood products.  States she would rather die than receive blood products.   Kuper Rennels II,Lanesha Azzaro E 10/04/2011, 3:33 AM

## 2011-10-04 NOTE — OR Nursing (Signed)
Fundal massage DLWegenr RN 

## 2011-10-04 NOTE — Op Note (Signed)
NAMEAYONNA, SPERANZA NO.:  [id number]  MEDICAL RECORD NO.:  [id number]  LOCATION:  9105                          FACILITY:  [facility]  PHYSICIAN:  Guy Sandifer. Henderson Cloud, M.D. DATE OF BIRTH:  1985-10-27  DATE OF PROCEDURE:  10/04/2011 DATE OF DISCHARGE:                              OPERATIVE REPORT   PREOPERATIVE DIAGNOSES: 1. Intrauterine pregnancy at 75 weeks estimated gestational age. 2. Previous cesarean section. 3. Spontaneous rupture of membranes. 4. Labor.  POSTOPERATIVE DIAGNOSES: 1. Intrauterine pregnancy at 60 week's estimated gestational age. 2. Previous cesarean section. 3. Spontaneous rupture of membranes. 4. Labor.  PROCEDURE:  Repeat low transverse cesarean section.  SURGEON:  Guy Sandifer. Henderson Cloud, MD  ANESTHESIA:  Spinal, Angelica Pou, MD  ESTIMATED BLOOD LOSS:  400 mL.  FINDINGS:  Viable female infant, Apgars of 9 and 10 at 1 and 5 minutes respectively.  Birth weight and arterial cord pH pending.  INDICATIONS AND CONSENT:  The patient is a G2, P1 at 39 weeks estimated gestational age.  Her water broke about 1 o'clock accompanied by contractions.  Contractions were strong and regular.  Fetal heart tones were reactive.  The patient requests repeat cesarean section.  Potential risks and complications were reviewed preoperatively including not limited to, infection, organ damage, bleeding requiring transfusion of blood products with HIV and hepatitis acquisition, DVT, PE, pneumonia. The patient states that she refuses transfusion of blood products. States that she would rather bleed to death and accept transfusion of blood products.  Consent was signed on the chart.  PROCEDURE:  The patient was taken to the operating room where she was identified.  Spinal anesthetic was placed and she was placed in dorsal supine position with a 15 degree left lateral wedge.  Time-out undertaken.  The patient again affirms in the operating room that she declined  blood products and would rather die than accept blood products. The patient was then prepped.  Foley catheter was placed in the bladder to drain.  She was draped in sterile fashion.  After testing for adequate spinal anesthesia, the old scar was taken out on the way in and the Pfannenstiel incision was made.  Dissection was carried out in layers to the peritoneum which was incised and extended superiorly and inferiorly.  Vesicouterine peritoneum was taken down cephalad laterally. The bladder flap was developed and bladder blade was placed.  Uterus was incised in a low transverse manner and the uterine cavity was entered bluntly with a hemostat.  The uterine incision was extended cephalad with fingers.  An artificial rupture of the remaining bag of waters reveals clear fluid.  Vertex was delivered and the baby was delivered without difficulty.  Cord was clamped and cut and the baby was handed to awaiting pediatrics team.  Placenta was manually delivered.  Uterus was clean.  Uterus was closed in 2 running locking imbricating layers of 0 Monocryl suture which achieves good hemostasis.  Anterior peritoneum was closed in running fashion with 0 Monocryl suture which was also used to reapproximate the pyramidalis muscle in midline.  Anterior rectus fascia was closed in running fashion with a 0 looped PDS.  The adipose layer was reapproximated  with interrupted plain suture and the skin was closed with a 4-0 Vicryl on a Keith needle.  Steri-Strips and dressings were applied.  All counts were correct.  The patient was taken to recovery room in stable condition.     Guy Sandifer Henderson Cloud, M.D.     JET/MEDQ  D:  10/04/2011  T:  10/04/2011  Job:  [id number]

## 2011-10-04 NOTE — Anesthesia Preprocedure Evaluation (Addendum)
Anesthesia Evaluation  Patient identified by MRN, date of birth, ID band Patient awake    Reviewed: Allergy & Precautions, H&P , Patient's Chart, lab work & pertinent test results  Airway Mallampati: II TM Distance: >3 FB Neck ROM: Full    Dental No notable dental hx. (+) Teeth Intact   Pulmonary neg pulmonary ROS,  breath sounds clear to auscultation  Pulmonary exam normal       Cardiovascular negative cardio ROS  Rhythm:Regular Rate:Normal     Neuro/Psych negative neurological ROS  negative psych ROS   GI/Hepatic negative GI ROS, Neg liver ROS,   Endo/Other  negative endocrine ROS  Renal/GU negative Renal ROS     Musculoskeletal   Abdominal Normal abdominal exam  (+)   Peds  Hematology negative hematology ROS (+)   Anesthesia Other Findings   Reproductive/Obstetrics (+) Pregnancy                           Anesthesia Physical Anesthesia Plan  ASA: II and Emergent  Anesthesia Plan: Spinal   Post-op Pain Management:    Induction:   Airway Management Planned:   Additional Equipment:   Intra-op Plan:   Post-operative Plan:   Informed Consent: I have reviewed the patients History and Physical, chart, labs and discussed the procedure including the risks, benefits and alternatives for the proposed anesthesia with the patient or authorized representative who has indicated his/her understanding and acceptance.     Plan Discussed with: Anesthesiologist, CRNA and Surgeon  Anesthesia Plan Comments:         Anesthesia Quick Evaluation

## 2011-10-04 NOTE — Anesthesia Procedure Notes (Signed)
Spinal  Patient location during procedure: OR Start time: 10/04/2011 3:44 AM Staffing Anesthesiologist: Trevelle Mcgurn A. Performed by: anesthesiologist  Preanesthetic Checklist Completed: patient identified, site marked, surgical consent, pre-op evaluation, timeout performed, IV checked, risks and benefits discussed and monitors and equipment checked Spinal Block Patient position: sitting Prep: site prepped and draped and DuraPrep Patient monitoring: heart rate, cardiac monitor, continuous pulse ox and blood pressure Approach: midline Location: L3-4 Injection technique: single-shot Needle Needle type: Sprotte  Needle gauge: 24 G Needle length: 9 cm Needle insertion depth: 4 cm Assessment Sensory level: T4 Additional Notes Patient tolerated procedure well. Adequate sensory level.

## 2011-10-04 NOTE — Addendum Note (Signed)
Addendum  created 10/04/11 1454 by Lincoln Brigham, CRNA   Modules edited:Notes Section

## 2011-10-04 NOTE — Progress Notes (Signed)
Dr Henderson Cloud notified of patient, her history, desires repeat, tracing, ctx pattern and sve result and order to prep c-section and he call OR and come to see patient as soon as possible

## 2011-10-04 NOTE — Transfer of Care (Signed)
Immediate Anesthesia Transfer of Care Note  Patient: Alexandria Cherry  Procedure(s) Performed: Procedure(s) (LRB): CESAREAN SECTION (N/A)  Patient Location: PACU  Anesthesia Type: Spinal  Level of Consciousness: awake, alert  and oriented  Airway & Oxygen Therapy: Patient Spontanous Breathing  Post-op Assessment: Report given to PACU RN and Post -op Vital signs reviewed and stable  Post vital signs: stable  Complications: No apparent anesthesia complications

## 2011-10-04 NOTE — Progress Notes (Signed)
Patient is in with c/o ctx q84m for an hour and possible rom. She states that she is scheduled for repeat c-section on Monday. She reports good fetal movement

## 2011-10-04 NOTE — ED Notes (Signed)
Dr. Foster in to see pt. 

## 2011-10-04 NOTE — Anesthesia Postprocedure Evaluation (Signed)
  Anesthesia Post-op Note  Patient: Alexandria Cherry  Procedure(s) Performed: Procedure(s) (LRB): CESAREAN SECTION (N/A)  Patient Location: Mother/Baby  Anesthesia Type: Spinal  Level of Consciousness: awake, alert  and oriented  Airway and Oxygen Therapy: Patient Spontanous Breathing  Post-op Pain: mild  Post-op Assessment: Patient's Cardiovascular Status Stable, Respiratory Function Stable, Patent Airway, No signs of Nausea or vomiting and Pain level controlled  Post-op Vital Signs: stable  Complications: No apparent anesthesia complications

## 2011-10-05 ENCOUNTER — Encounter (HOSPITAL_COMMUNITY): Payer: Self-pay | Admitting: Obstetrics and Gynecology

## 2011-10-05 ENCOUNTER — Encounter (HOSPITAL_COMMUNITY): Admission: RE | Payer: Self-pay | Source: Ambulatory Visit

## 2011-10-05 ENCOUNTER — Inpatient Hospital Stay (HOSPITAL_COMMUNITY)
Admission: RE | Admit: 2011-10-05 | Payer: BC Managed Care – PPO | Source: Ambulatory Visit | Admitting: Obstetrics and Gynecology

## 2011-10-05 LAB — CBC
MCV: 88.8 fL (ref 78.0–100.0)
Platelets: 241 10*3/uL (ref 150–400)
RDW: 14.5 % (ref 11.5–15.5)
WBC: 11.7 10*3/uL — ABNORMAL HIGH (ref 4.0–10.5)

## 2011-10-05 SURGERY — Surgical Case
Anesthesia: Regional

## 2011-10-05 MED ORDER — OXYCODONE-ACETAMINOPHEN 5-325 MG/5ML PO SOLN
5.0000 mL | ORAL | Status: DC | PRN
  Administered 2011-10-05 – 2011-10-06 (×4): 5 mL via ORAL
  Filled 2011-10-05 (×4): qty 5

## 2011-10-05 MED ORDER — INFLUENZA VIRUS VACC SPLIT PF IM SUSP
0.5000 mL | INTRAMUSCULAR | Status: DC
  Filled 2011-10-05: qty 0.5

## 2011-10-05 NOTE — Progress Notes (Signed)
Subjective: Postpartum Day 2: Cesarean Delivery Patient reports incisional pain, tolerating PO and no problems voiding.    Objective: Vital signs in last 24 hours: Temp:  [97.3 F (36.3 C)-98.5 F (36.9 C)] 98.1 F (36.7 C) (03/04 0534) Pulse Rate:  [64-72] 64  (03/04 0534) Resp:  [18] 18  (03/04 0534) BP: (96-122)/(57-77) 104/67 mmHg (03/04 0534) SpO2:  [96 %-99 %] 99 % (03/04 0230)  Physical Exam:  General: alert, cooperative and appears stated age Lochia: appropriate Uterine Fundus: firm Incision: healing well, no significant drainage, no dehiscence, no significant erythema DVT Evaluation: No evidence of DVT seen on physical exam.   Basename 10/05/11 0515 10/04/11 0314  HGB 10.6* 11.4*  HCT 32.6* 34.9*    Assessment/Plan: Status post Cesarean section. Doing well postoperatively.  Continue current care.  Kaylanni Ezelle L 10/05/2011, 7:39 AM

## 2011-10-06 MED ORDER — IBUPROFEN 100 MG/5ML PO SUSP: 600.0000 mg | Freq: Four times a day (QID) | ORAL | Status: DC

## 2011-10-06 MED ORDER — OXYCODONE-ACETAMINOPHEN 5-325 MG/5ML PO SOLN: 5.0000 mL | ORAL | Status: AC | PRN

## 2011-10-06 MED ORDER — INFLUENZA VIRUS VACC SPLIT PF IM SUSP
0.5000 mL | INTRAMUSCULAR | Status: AC
  Administered 2011-10-06: 0.5 mL via INTRAMUSCULAR
  Filled 2011-10-06: qty 0.5

## 2011-10-06 NOTE — Discharge Summary (Signed)
Obstetric Discharge Summary Reason for Admission: SROM, previous cesarean Prenatal Procedures: ultrasound Intrapartum Procedures: cesarean: low cervical, transverse Postpartum Procedures: none Complications-Operative and Postpartum: none Hemoglobin  Date Value Range Status  10/05/2011 10.6* 12.0-15.0 (g/dL) Final     HCT  Date Value Range Status  10/05/2011 32.6* 36.0-46.0 (%) Final    Discharge Diagnoses: Term Pregnancy-delivered  Discharge Information: Date: 10/06/2011 Activity: pelvic rest Diet: routine Medications: PNV, Ibuprofen and Percocet Condition: stable Instructions: refer to practice specific booklet Discharge to: home   Newborn Data: Live born female  Birth Weight: 8 lb 10.5 oz (3925 g) APGAR: 9, 10  Home with mother.  CURTIS,CAROL G 10/06/2011, 8:02 AM

## 2011-10-06 NOTE — Progress Notes (Signed)
Subjective: Postpartum Day 2: Cesarean Delivery Patient reports tolerating PO, + flatus and no problems voiding.    Objective: Vital signs in last 24 hours: Temp:  [97.5 F (36.4 C)-98.6 F (37 C)] 98.6 F (37 C) (03/05 0535) Pulse Rate:  [62-76] 63  (03/05 0535) Resp:  [18] 18  (03/05 0535) BP: (98-110)/(63-70) 110/70 mmHg (03/05 0535) SpO2:  [96 %] 96 % (03/04 2046)  Physical Exam:  General: alert and cooperative Lochia: appropriate Uterine Fundus: firm Incision: healing well DVT Evaluation: No evidence of DVT seen on physical exam.   Basename 10/05/11 0515 10/04/11 0314  HGB 10.6* 11.4*  HCT 32.6* 34.9*    Assessment/Plan: Status post Cesarean section. Doing well postoperatively.  Discharge home with standard precautions and return to clinic in 1 week.  CURTIS,CAROL G 10/06/2011, 7:53 AM

## 2013-03-16 ENCOUNTER — Encounter (HOSPITAL_COMMUNITY)
Admission: AD | Disposition: A | Discharge status: Home / Self Care | Payer: Self-pay | Source: Ambulatory Visit | Attending: Obstetrics and Gynecology

## 2013-03-16 ENCOUNTER — Inpatient Hospital Stay (HOSPITAL_COMMUNITY): Payer: BC Managed Care – PPO | Admitting: Anesthesiology

## 2013-03-16 ENCOUNTER — Encounter (HOSPITAL_COMMUNITY): Payer: Self-pay | Admitting: Anesthesiology

## 2013-03-16 ENCOUNTER — Inpatient Hospital Stay (HOSPITAL_COMMUNITY)
Admission: AD | Admit: 2013-03-16 | Discharge: 2013-03-19 | DRG: 371 | Disposition: A | Discharge status: Home / Self Care | Payer: BC Managed Care – PPO | Source: Ambulatory Visit | Attending: Obstetrics and Gynecology | Admitting: Obstetrics and Gynecology

## 2013-03-16 ENCOUNTER — Encounter (HOSPITAL_COMMUNITY): Payer: Self-pay | Admitting: *Deleted

## 2013-03-16 DIAGNOSIS — O34219 Maternal care for unspecified type scar from previous cesarean delivery: Principal | ICD-10-CM | POA: Diagnosis present

## 2013-03-16 DIAGNOSIS — Z98891 History of uterine scar from previous surgery: Secondary | ICD-10-CM

## 2013-03-16 LAB — CBC
MCHC: 33.7 g/dL (ref 30.0–36.0)
MCV: 87 fL (ref 78.0–100.0)
Platelets: 253 10*3/uL (ref 150–400)
RDW: 13.8 % (ref 11.5–15.5)
WBC: 9.3 10*3/uL (ref 4.0–10.5)

## 2013-03-16 SURGERY — Surgical Case
Anesthesia: Spinal | Site: Abdomen | Wound class: Clean Contaminated

## 2013-03-16 MED ORDER — OXYTOCIN 40 UNITS IN LACTATED RINGERS INFUSION - SIMPLE MED: 62.5000 mL/h | INTRAVENOUS | Status: AC

## 2013-03-16 MED ORDER — KETOROLAC TROMETHAMINE 60 MG/2ML IM SOLN
INTRAMUSCULAR | Status: AC
  Administered 2013-03-16: 60 mg via INTRAMUSCULAR
  Filled 2013-03-16: qty 2

## 2013-03-16 MED ORDER — SIMETHICONE 80 MG PO CHEW
80.0000 mg | CHEWABLE_TABLET | ORAL | Status: DC | PRN
  Administered 2013-03-17: 80 mg via ORAL

## 2013-03-16 MED ORDER — DIPHENHYDRAMINE HCL 25 MG PO CAPS: 25.0000 mg | ORAL_CAPSULE | Freq: Four times a day (QID) | ORAL | Status: DC | PRN

## 2013-03-16 MED ORDER — PRENATAL MULTIVITAMIN CH
1.0000 | ORAL_TABLET | Freq: Every day | ORAL | Status: DC
  Filled 2013-03-16: qty 1

## 2013-03-16 MED ORDER — MORPHINE SULFATE 0.5 MG/ML IJ SOLN
INTRAMUSCULAR | Status: AC
  Filled 2013-03-16: qty 10

## 2013-03-16 MED ORDER — MEPERIDINE HCL 25 MG/ML IJ SOLN
INTRAMUSCULAR | Status: AC
  Administered 2013-03-16: 6.25 mg via INTRAVENOUS
  Filled 2013-03-16: qty 1

## 2013-03-16 MED ORDER — ONDANSETRON HCL 4 MG/2ML IJ SOLN
INTRAMUSCULAR | Status: DC | PRN
  Administered 2013-03-16: 4 mg via INTRAVENOUS

## 2013-03-16 MED ORDER — MENTHOL 3 MG MT LOZG: 1.0000 | LOZENGE | OROMUCOSAL | Status: DC | PRN

## 2013-03-16 MED ORDER — SODIUM CHLORIDE 0.9 % IV SOLN: 250.0000 mL | INTRAVENOUS | Status: DC

## 2013-03-16 MED ORDER — 0.9 % SODIUM CHLORIDE (POUR BTL) OPTIME
TOPICAL | Status: DC | PRN
  Administered 2013-03-16: 1000 mL

## 2013-03-16 MED ORDER — DIPHENHYDRAMINE HCL 50 MG/ML IJ SOLN: 25.0000 mg | INTRAMUSCULAR | Status: DC | PRN

## 2013-03-16 MED ORDER — LACTATED RINGERS IV SOLN: INTRAVENOUS | Status: DC

## 2013-03-16 MED ORDER — CITRIC ACID-SODIUM CITRATE 334-500 MG/5ML PO SOLN
30.0000 mL | Freq: Once | ORAL | Status: AC
  Administered 2013-03-16: 30 mL via ORAL
  Filled 2013-03-16: qty 15

## 2013-03-16 MED ORDER — NALOXONE HCL 0.4 MG/ML IJ SOLN: 0.4000 mg | INTRAMUSCULAR | Status: DC | PRN

## 2013-03-16 MED ORDER — NALOXONE HCL 1 MG/ML IJ SOLN
1.0000 ug/kg/h | INTRAVENOUS | Status: DC | PRN
  Filled 2013-03-16: qty 2

## 2013-03-16 MED ORDER — FENTANYL CITRATE 0.05 MG/ML IJ SOLN
INTRAMUSCULAR | Status: AC
  Filled 2013-03-16: qty 2

## 2013-03-16 MED ORDER — ONDANSETRON HCL 4 MG/2ML IJ SOLN
INTRAMUSCULAR | Status: AC
  Filled 2013-03-16: qty 2

## 2013-03-16 MED ORDER — DIPHENHYDRAMINE HCL 25 MG PO CAPS: 25.0000 mg | ORAL_CAPSULE | ORAL | Status: DC | PRN

## 2013-03-16 MED ORDER — SODIUM CHLORIDE 0.9 % IJ SOLN: 3.0000 mL | INTRAMUSCULAR | Status: DC | PRN

## 2013-03-16 MED ORDER — CEFAZOLIN SODIUM 1-5 GM-% IV SOLN
1.0000 g | Freq: Three times a day (TID) | INTRAVENOUS | Status: DC
  Administered 2013-03-16: 1 g via INTRAVENOUS
  Filled 2013-03-16 (×3): qty 50

## 2013-03-16 MED ORDER — TETANUS-DIPHTH-ACELL PERTUSSIS 5-2.5-18.5 LF-MCG/0.5 IM SUSP: 0.5000 mL | Freq: Once | INTRAMUSCULAR | Status: DC

## 2013-03-16 MED ORDER — NALBUPHINE HCL 10 MG/ML IJ SOLN
5.0000 mg | INTRAMUSCULAR | Status: DC | PRN
  Filled 2013-03-16: qty 1

## 2013-03-16 MED ORDER — DIBUCAINE 1 % RE OINT: 1.0000 "application " | TOPICAL_OINTMENT | RECTAL | Status: DC | PRN

## 2013-03-16 MED ORDER — FAMOTIDINE IN NACL 20-0.9 MG/50ML-% IV SOLN
20.0000 mg | Freq: Once | INTRAVENOUS | Status: AC
  Administered 2013-03-16: 20 mg via INTRAVENOUS
  Filled 2013-03-16: qty 50

## 2013-03-16 MED ORDER — FENTANYL CITRATE 0.05 MG/ML IJ SOLN: 25.0000 ug | INTRAMUSCULAR | Status: DC | PRN

## 2013-03-16 MED ORDER — IBUPROFEN 600 MG PO TABS: 600.0000 mg | ORAL_TABLET | Freq: Four times a day (QID) | ORAL | Status: DC

## 2013-03-16 MED ORDER — SODIUM CHLORIDE 0.9 % IJ SOLN: 3.0000 mL | Freq: Two times a day (BID) | INTRAMUSCULAR | Status: DC

## 2013-03-16 MED ORDER — SCOPOLAMINE 1 MG/3DAYS TD PT72
MEDICATED_PATCH | TRANSDERMAL | Status: AC
  Administered 2013-03-16: 1.5 mg via TRANSDERMAL
  Filled 2013-03-16: qty 1

## 2013-03-16 MED ORDER — BUPIVACAINE IN DEXTROSE 0.75-8.25 % IT SOLN
INTRATHECAL | Status: DC | PRN
  Administered 2013-03-16: 1.5 mL via INTRATHECAL

## 2013-03-16 MED ORDER — MORPHINE SULFATE (PF) 0.5 MG/ML IJ SOLN
INTRAMUSCULAR | Status: DC | PRN
  Administered 2013-03-16: .1 mg via INTRATHECAL

## 2013-03-16 MED ORDER — OXYTOCIN 10 UNIT/ML IJ SOLN
40.0000 [IU] | INTRAVENOUS | Status: DC | PRN
  Administered 2013-03-16: 40 [IU] via INTRAVENOUS

## 2013-03-16 MED ORDER — METOCLOPRAMIDE HCL 5 MG/ML IJ SOLN: 10.0000 mg | Freq: Once | INTRAMUSCULAR | Status: AC

## 2013-03-16 MED ORDER — IBUPROFEN 100 MG/5ML PO SUSP
600.0000 mg | Freq: Four times a day (QID) | ORAL | Status: DC
  Administered 2013-03-17 – 2013-03-19 (×11): 600 mg via ORAL
  Filled 2013-03-16 (×12): qty 30

## 2013-03-16 MED ORDER — SCOPOLAMINE 1 MG/3DAYS TD PT72: 1.0000 | MEDICATED_PATCH | Freq: Once | TRANSDERMAL | Status: AC

## 2013-03-16 MED ORDER — LACTATED RINGERS IV SOLN
INTRAVENOUS | Status: DC | PRN
  Administered 2013-03-16: 11:00:00 via INTRAVENOUS

## 2013-03-16 MED ORDER — OXYTOCIN 10 UNIT/ML IJ SOLN
INTRAMUSCULAR | Status: AC
  Filled 2013-03-16: qty 4

## 2013-03-16 MED ORDER — SIMETHICONE 80 MG PO CHEW
80.0000 mg | CHEWABLE_TABLET | Freq: Three times a day (TID) | ORAL | Status: DC
  Administered 2013-03-16 – 2013-03-19 (×8): 80 mg via ORAL

## 2013-03-16 MED ORDER — KETOROLAC TROMETHAMINE 60 MG/2ML IM SOLN: 60.0000 mg | Freq: Once | INTRAMUSCULAR | Status: AC | PRN

## 2013-03-16 MED ORDER — KETOROLAC TROMETHAMINE 30 MG/ML IJ SOLN
30.0000 mg | Freq: Four times a day (QID) | INTRAMUSCULAR | Status: AC | PRN
  Administered 2013-03-16: 30 mg via INTRAVENOUS
  Filled 2013-03-16: qty 1

## 2013-03-16 MED ORDER — FENTANYL CITRATE 0.05 MG/ML IJ SOLN
INTRAMUSCULAR | Status: DC | PRN
  Administered 2013-03-16: 15 ug via INTRATHECAL

## 2013-03-16 MED ORDER — KETOROLAC TROMETHAMINE 30 MG/ML IJ SOLN: 30.0000 mg | Freq: Four times a day (QID) | INTRAMUSCULAR | Status: AC | PRN

## 2013-03-16 MED ORDER — BISACODYL 10 MG RE SUPP: 10.0000 mg | Freq: Every day | RECTAL | Status: DC | PRN

## 2013-03-16 MED ORDER — ONDANSETRON HCL 4 MG/2ML IJ SOLN: 4.0000 mg | Freq: Three times a day (TID) | INTRAMUSCULAR | Status: DC | PRN

## 2013-03-16 MED ORDER — METOCLOPRAMIDE HCL 5 MG/ML IJ SOLN: 10.0000 mg | Freq: Three times a day (TID) | INTRAMUSCULAR | Status: DC | PRN

## 2013-03-16 MED ORDER — OXYCODONE-ACETAMINOPHEN 5-325 MG PO TABS: 1.0000 | ORAL_TABLET | ORAL | Status: DC | PRN

## 2013-03-16 MED ORDER — WITCH HAZEL-GLYCERIN EX PADS: 1.0000 "application " | MEDICATED_PAD | CUTANEOUS | Status: DC | PRN

## 2013-03-16 MED ORDER — FLEET ENEMA 7-19 GM/118ML RE ENEM: 1.0000 | ENEMA | Freq: Every day | RECTAL | Status: DC | PRN

## 2013-03-16 MED ORDER — MEPERIDINE HCL 25 MG/ML IJ SOLN: 6.2500 mg | INTRAMUSCULAR | Status: DC | PRN

## 2013-03-16 MED ORDER — ONDANSETRON HCL 4 MG/2ML IJ SOLN: 4.0000 mg | INTRAMUSCULAR | Status: DC | PRN

## 2013-03-16 MED ORDER — ZOLPIDEM TARTRATE 5 MG PO TABS: 5.0000 mg | ORAL_TABLET | Freq: Every evening | ORAL | Status: DC | PRN

## 2013-03-16 MED ORDER — SENNOSIDES-DOCUSATE SODIUM 8.6-50 MG PO TABS
2.0000 | ORAL_TABLET | Freq: Every day | ORAL | Status: DC
  Administered 2013-03-16 – 2013-03-18 (×3): 2 via ORAL

## 2013-03-16 MED ORDER — METOCLOPRAMIDE HCL 5 MG/ML IJ SOLN
INTRAMUSCULAR | Status: AC
  Administered 2013-03-16: 10 mg via INTRAVENOUS
  Filled 2013-03-16: qty 2

## 2013-03-16 MED ORDER — LANOLIN HYDROUS EX OINT: 1.0000 "application " | TOPICAL_OINTMENT | CUTANEOUS | Status: DC | PRN

## 2013-03-16 MED ORDER — ONDANSETRON HCL 4 MG PO TABS: 4.0000 mg | ORAL_TABLET | ORAL | Status: DC | PRN

## 2013-03-16 MED ORDER — DIPHENHYDRAMINE HCL 50 MG/ML IJ SOLN: 12.5000 mg | INTRAMUSCULAR | Status: DC | PRN

## 2013-03-16 MED ORDER — LACTATED RINGERS IV SOLN
INTRAVENOUS | Status: DC
  Administered 2013-03-16 (×3): via INTRAVENOUS

## 2013-03-16 SURGICAL SUPPLY — 30 items
ADH SKN CLS LQ APL DERMABOND (GAUZE/BANDAGES/DRESSINGS) ×1
CLAMP CORD UMBIL (MISCELLANEOUS) IMPLANT
CLOTH BEACON ORANGE TIMEOUT ST (SAFETY) ×2 IMPLANT
DERMABOND ADHESIVE PROPEN (GAUZE/BANDAGES/DRESSINGS) ×1
DERMABOND ADVANCED .7 DNX6 (GAUZE/BANDAGES/DRESSINGS) ×1 IMPLANT
DRAPE LG THREE QUARTER DISP (DRAPES) ×2 IMPLANT
DRSG OPSITE POSTOP 4X10 (GAUZE/BANDAGES/DRESSINGS) ×2 IMPLANT
DURAPREP 26ML APPLICATOR (WOUND CARE) ×2 IMPLANT
ELECT REM PT RETURN 9FT ADLT (ELECTROSURGICAL) ×2
ELECTRODE REM PT RTRN 9FT ADLT (ELECTROSURGICAL) ×1 IMPLANT
EXTRACTOR VACUUM M CUP 4 TUBE (SUCTIONS) IMPLANT
GLOVE BIO SURGEON STRL SZ7 (GLOVE) ×2 IMPLANT
GOWN STRL REIN XL XLG (GOWN DISPOSABLE) ×4 IMPLANT
KIT ABG SYR 3ML LUER SLIP (SYRINGE) ×2 IMPLANT
NDL HYPO 25X5/8 SAFETYGLIDE (NEEDLE) ×1 IMPLANT
NEEDLE HYPO 25X5/8 SAFETYGLIDE (NEEDLE) ×2 IMPLANT
NS IRRIG 1000ML POUR BTL (IV SOLUTION) ×2 IMPLANT
PACK C SECTION WH (CUSTOM PROCEDURE TRAY) ×2 IMPLANT
PAD OB MATERNITY 4.3X12.25 (PERSONAL CARE ITEMS) ×2 IMPLANT
STAPLER VISISTAT 35W (STAPLE) IMPLANT
SUT CHROMIC 0 CTX 36 (SUTURE) ×4 IMPLANT
SUT MON AB 4-0 PS1 27 (SUTURE) ×2 IMPLANT
SUT PDS AB 0 CT 36 (SUTURE) ×2 IMPLANT
SUT PLAIN 0 NONE (SUTURE) IMPLANT
SUT PLAIN 2 0 XLH (SUTURE) ×1 IMPLANT
SUT VIC AB 3-0 CT1 27 (SUTURE) ×2
SUT VIC AB 3-0 CT1 TAPERPNT 27 (SUTURE) ×1 IMPLANT
TOWEL OR 17X24 6PK STRL BLUE (TOWEL DISPOSABLE) ×2 IMPLANT
TRAY FOLEY CATH 14FR (SET/KITS/TRAYS/PACK) ×2 IMPLANT
WATER STERILE IRR 1000ML POUR (IV SOLUTION) ×2 IMPLANT

## 2013-03-16 NOTE — Anesthesia Postprocedure Evaluation (Signed)
Anesthesia Post Note  Patient: Alexandria Cherry  Procedure(s) Performed: Procedure(s) (LRB): CESAREAN SECTION (N/A)  Anesthesia type: Spinal  Patient location: Mother/Baby  Post pain: Pain level controlled  Post assessment: Post-op Vital signs reviewed  Last Vitals:  Filed Vitals:   03/16/13 1615  BP: 107/64  Pulse: 61  Temp: 37 C  Resp: 18    Post vital signs: Reviewed  Level of consciousness: awake  Complications: No apparent anesthesia complications

## 2013-03-16 NOTE — Anesthesia Preprocedure Evaluation (Addendum)
Anesthesia Evaluation  Patient identified by MRN, date of birth, ID band Patient awake    Reviewed: Allergy & Precautions, H&P , NPO status , Patient's Chart, lab work & pertinent test results, reviewed documented beta blocker date and time   History of Anesthesia Complications Negative for: history of anesthetic complications  Airway Mallampati: II TM Distance: >3 FB Neck ROM: full    Dental  (+) Teeth Intact   Pulmonary neg pulmonary ROS,  breath sounds clear to auscultation        Cardiovascular negative cardio ROS  Rhythm:regular Rate:Normal     Neuro/Psych negative neurological ROS  negative psych ROS   GI/Hepatic negative GI ROS, Neg liver ROS,   Endo/Other  negative endocrine ROS  Renal/GU negative Renal ROS  negative genitourinary   Musculoskeletal   Abdominal   Peds  Hematology  (+) REFUSES BLOOD PRODUCTS,   Anesthesia Other Findings Ate spoonful of yogurt at 0730, last full meal 8:00 pm  CBC pending  Reproductive/Obstetrics (+) Pregnancy (h/o c/s x2, in labor for repeat c/s)                          Anesthesia Physical Anesthesia Plan  ASA: II and emergent  Anesthesia Plan: Spinal   Post-op Pain Management:    Induction:   Airway Management Planned:   Additional Equipment:   Intra-op Plan:   Post-operative Plan:   Informed Consent: I have reviewed the patients History and Physical, chart, labs and discussed the procedure including the risks, benefits and alternatives for the proposed anesthesia with the patient or authorized representative who has indicated his/her understanding and acceptance.     Plan Discussed with: Surgeon and CRNA  Anesthesia Plan Comments:         Anesthesia Quick Evaluation

## 2013-03-16 NOTE — MAU Note (Signed)
Patient states she has been having contractions every 5 minutes. Denies bleeding or leaking and reports good fetal movement. Patient is scheduled for a repeat cesarean section on 8-28.

## 2013-03-16 NOTE — Anesthesia Procedure Notes (Signed)
Spinal  Patient location during procedure: OR Start time: 03/16/2013 10:40 AM Staffing Performed by: anesthesiologist  Preanesthetic Checklist Completed: patient identified, site marked, surgical consent, pre-op evaluation, timeout performed, IV checked, risks and benefits discussed and monitors and equipment checked Spinal Block Patient position: sitting Prep: site prepped and draped and DuraPrep Patient monitoring: blood pressure, continuous pulse ox and heart rate Approach: midline Location: L3-4 Injection technique: single-shot Needle Needle type: Pencan  Needle gauge: 24 G Needle length: 9 cm Assessment Sensory level: T4 Additional Notes Clear free flow CSF on first attempt.  No paresthesia. Patient tolerated procedure well with no apparent complications.  Jasmine December, MD

## 2013-03-16 NOTE — H&P (Signed)
Alexandria Cherry is a 27 y.o. female presenting at 37 weeks with spontaneous onset of labor.  Two prior cesarean sections for repeat. Maternal Medical History:  Reason for admission: Contractions.   Contractions: Onset was 1-2 hours ago.   Frequency: regular.   Perceived severity is moderate.    Fetal activity: Perceived fetal activity is normal.    Prenatal complications: Two prior cesarean sections for repeat  Prenatal Complications - Diabetes: none.    OB History   Grav Para Term Preterm Abortions TAB SAB Ect Mult Living   3 2 2  0 0 0 0 0 0 2     Past Medical History  Diagnosis Date  . No pertinent past medical history   . Rhinitis recent onset 09/30/11  . Medical history non-contributory    Past Surgical History  Procedure Laterality Date  . Cesarean section  06/2009  . Cesarean section  10/04/2011    Procedure: CESAREAN SECTION;  Surgeon: Alexandria Andrea, MD;  Location: [facility];  Service: Gynecology;  Laterality: N/A;  Repeat cesarean section of baby boy  at  12 APGAR 9/10   Family History: family history is negative for Anesthesia problems. Social History:  reports that she has never smoked. She does not have any smokeless tobacco history on file. She reports that she does not drink alcohol or use illicit drugs.   Prenatal Transfer Tool  Maternal Diabetes: No Genetic Screening: Declined Maternal Ultrasounds/Referrals: Normal Fetal Ultrasounds or other Referrals:  None Maternal Substance Abuse:  No Significant Maternal Medications:  None Significant Maternal Lab Results:  None Other Comments:  None  ROS  Dilation: 4 Effacement (%): 90 Station: -1 Exam by:: K.WIlson,RN Blood pressure 128/80, pulse 80, temperature 98.4 F (36.9 C), resp. rate 18, height 5' 6.5" (1.689 m), weight 90.719 kg (200 lb). Maternal Exam:  Uterine Assessment: Contraction strength is moderate.  Contraction frequency is regular.   Abdomen: Patient reports no abdominal tenderness.  Surgical scars: low transverse.   Fundal height is c/w dates.   Estimated fetal weight is 7.   Fetal presentation: vertex  Cervix: 4-5 cm 90 % membranes bulging  Physical Exam  Constitutional: She is oriented to person, place, and time.  Cardiovascular: Normal rate, regular rhythm and normal heart sounds.   Respiratory: Effort normal and breath sounds normal.  Musculoskeletal: She exhibits edema.  Neurological: She is alert and oriented to person, place, and time. She has normal reflexes.    Prenatal labs: ABO, Rh:   Antibody:   Rubella:   RPR:    HBsAg:    HIV:    GBS:     Assessment/Plan: Intrauterine pregnancy  at 37 weeks with SOL  For repeat cesarean section  Risk of cesarean section discussed.  These include:  Risk of infection;  Risk of hemorrhage that could require transfusions with the associated risk of aids or hepatitis;  Excessive bleeding could require hysterectomy;  Risk of injury to adjacent organs including bladder, bowel or ureters;  Risk of DVT's and possible pulmonary embolus.  Patient expresses a understanding of indications and risks.;Patient will not take blood products for religious reasons  Explained risk including death. Alexandria Cherry S 04-09-13, 8:59 AM

## 2013-03-16 NOTE — Transfer of Care (Signed)
Immediate Anesthesia Transfer of Care Note  Patient: Alexandria Cherry  Procedure(s) Performed: Procedure(s): CESAREAN SECTION (N/A)  Patient Location: PACU  Anesthesia Type:Spinal  Level of Consciousness: awake, alert  and oriented  Airway & Oxygen Therapy: Patient Spontanous Breathing  Post-op Assessment: Report given to PACU RN and Post -op Vital signs reviewed and stable  Post vital signs: Reviewed and stable  Complications: No apparent anesthesia complications

## 2013-03-16 NOTE — Anesthesia Postprocedure Evaluation (Signed)
  Anesthesia Post Note  Patient: Alexandria Cherry  Procedure(s) Performed: Procedure(s) (LRB): CESAREAN SECTION (N/A)  Anesthesia type: Spinal  Patient location: PACU  Post pain: Pain level controlled  Post assessment: Post-op Vital signs reviewed  Last Vitals:  Filed Vitals:   03/16/13 1200  BP:   Pulse: 71  Temp:   Resp: 18    Post vital signs: Reviewed  Level of consciousness: awake  Complications: No apparent anesthesia complications

## 2013-03-16 NOTE — Brief Op Note (Signed)
03/16/2013  11:20 AM  PATIENT:  Alexandria Cherry  27 y.o. female  PRE-OPERATIVE DIAGNOSIS:  repeat/labor  POST-OPERATIVE DIAGNOSIS:  repeat in labor   PROCEDURE:  Procedure(s): CESAREAN SECTION (N/A)  SURGEON:  Surgeon(s) and Role:    * Juluis Mire, MD - Primary  PHYSICIAN ASSISTANT:   ASSISTANTS: none   ANESTHESIA:   spinal  EBL:  Total I/O In: 2000 [I.V.:2000] Out: 850 [Urine:350; Blood:500]  BLOOD ADMINISTERED:none  DRAINS: Urinary Catheter (Foley)   LOCAL MEDICATIONS USED:  NONE  SPECIMEN:  No Specimen  DISPOSITION OF SPECIMEN:  N/A  COUNTS:  YES  TOURNIQUET:  * No tourniquets in log *  DICTATION: .Other Dictation: Dictation Number [id number]  PLAN OF CARE: Admit to inpatient   PATIENT DISPOSITION:  PACU - hemodynamically stable.   Delay start of Pharmacological VTE agent (>24hrs) due to surgical blood loss or risk of bleeding: yes

## 2013-03-16 NOTE — Op Note (Signed)
NAME:  Alexandria Cherry, Alexandria Cherry NO.:  [id number]  MEDICAL RECORD NO.:  [id number]  LOCATION:  9140                          FACILITY:  [facility]  PHYSICIAN:  Juluis Mire, M.D.   DATE OF BIRTH:  [birth year]  DATE OF PROCEDURE:  03/16/2013 DATE OF DISCHARGE:                              OPERATIVE REPORT   PREOPERATIVE DIAGNOSIS:  Intrauterine pregnancy at term with 2 prior cesarean sections for repeat cesarean section.  Spontaneous onset of labor.  POSTOPERATIVE DIAGNOSIS:  Intrauterine pregnancy at term with 2 prior cesarean sections for repeat cesarean section.  Spontaneous onset of labor.  PROCEDURE:  Repeat low-transverse cesarean section.  SURGEON:  Juluis Mire, M.D.  ANESTHESIA:  Spinal.  ESTIMATED BLOOD LOSS:  300 mL.  PACKS:  None.  DRAINS:  Urethral Foley.  INTRAOPERATIVE BLOOD PLACEMENT:  None.  COMPLICATIONS:  None.  INDICATIONS:  As dictated in the history and physical.  PROCEDURE IN DETAIL:  The patient was taken to OR, placed in supine position with left lateral tilt.  After satisfactory level of spinal anesthesia was obtained, the abdomen was prepped out with DuraPrep and draped as sterile field.  Prior low transverse skin incision was identified and excised.  The incision was extended through the subcutaneous tissue.  Fascia was entered sharply and incision fashioned laterally.  Fascia was taken off the muscle superiorly and inferiorly. Rectus muscles were separated in the midline.  Perineum was entered sharply.  Incision of perineum was extended both superiorly and inferiorly.  A low-transverse bladder flap was developed.  Low transverse uterine incision was begun with a knife and extended laterally using manual traction.  The infant was in the vertex presentation, was delivered elevation of head and fundal pressure.  The infant was a viable female.  Apgars were 8/9.  Placenta was delivered manually and sent to labor and delivery.  Uterus was  exteriorized for closure.  Uterus was closed in a running locked suture of 0 chromic using a two-layer closure technique.  We had good hemostasis, clear urine output.  Tubes and ovaries were unremarkable.  Uterus was returned to abdominal cavity.  Muscles and peritoneum were closed with a running suture of 3-0 Vicryl.  Fascia was closed with running suture of 0 PDS. Subcutaneous fat was closed interrupted sutures of 0 plain catgut.  Skin was closed with running subcuticular of 4-0 Monocryl and Dermabond.  Sponge, instrument, and needle count was reported as correct by circulating nurse x2.  Foley catheter remained clear at the time of closure.  The patient tolerated the procedure well, returned to recovery room in good condition.     Juluis Mire, M.D.     JSM/MEDQ  D:  03/16/2013  T:  03/16/2013  Job:  [id number]

## 2013-03-16 NOTE — Op Note (Signed)
Patient name  Alexandria Cherry, Alexandria Cherry DICTATION#  [id number] CSN# [id number]  Juluis Mire, MD 03/16/2013 11:20 AM

## 2013-03-16 NOTE — Preoperative (Signed)
Beta Blockers   Reason not to administer Beta Blockers:Not Applicable 

## 2013-03-17 ENCOUNTER — Encounter (HOSPITAL_COMMUNITY): Payer: Self-pay | Admitting: Obstetrics and Gynecology

## 2013-03-17 LAB — CBC
Platelets: 224 10*3/uL (ref 150–400)
RBC: 3.45 MIL/uL — ABNORMAL LOW (ref 3.87–5.11)
RDW: 14 % (ref 11.5–15.5)
WBC: 11.1 10*3/uL — ABNORMAL HIGH (ref 4.0–10.5)

## 2013-03-17 MED ORDER — OXYCODONE HCL 5 MG/5ML PO SOLN
5.0000 mg | ORAL | Status: DC | PRN
  Administered 2013-03-17 – 2013-03-18 (×5): 5 mg via ORAL
  Administered 2013-03-18: 10 mg via ORAL
  Administered 2013-03-18 (×4): 5 mg via ORAL
  Administered 2013-03-18: 10 mg via ORAL
  Administered 2013-03-19 (×3): 5 mg via ORAL
  Filled 2013-03-17 (×14): qty 5
  Filled 2013-03-17: qty 10

## 2013-03-17 MED ORDER — ACETAMINOPHEN 160 MG/5ML PO SOLN: 325.0000 mg | ORAL | Status: DC | PRN

## 2013-03-17 MED ORDER — COMPLETENATE 29-1 MG PO CHEW
1.0000 | CHEWABLE_TABLET | Freq: Every day | ORAL | Status: DC
  Administered 2013-03-17 – 2013-03-19 (×3): 1 via ORAL
  Filled 2013-03-17 (×3): qty 1

## 2013-03-17 MED ORDER — ACETAMINOPHEN 160 MG/5ML PO SOLN
325.0000 mg | ORAL | Status: DC | PRN
  Administered 2013-03-17: 324.8 mg via ORAL
  Filled 2013-03-17 (×2): qty 20.3

## 2013-03-17 NOTE — Progress Notes (Signed)
Subjective: Postpartum Day 1: Cesarean Delivery Patient reports tolerating PO and no problems voiding.    Objective: Vital signs in last 24 hours: Temp:  [97.2 F (36.2 C)-98.8 F (37.1 C)] 97.2 F (36.2 C) (08/15 0433) Pulse Rate:  [56-78] 64 (08/15 0433) Resp:  [15-21] 18 (08/15 0433) BP: (102-118)/(55-76) 115/73 mmHg (08/15 0433) SpO2:  [96 %-99 %] 98 % (08/15 0433)  Physical Exam:  General: alert and cooperative Lochia: appropriate Uterine Fundus: firm Incision: honeycomb CDI DVT Evaluation: No evidence of DVT seen on physical exam. Negative Homan's sign. No cords or calf tenderness. No significant calf/ankle edema.   Recent Labs  03/16/13 0936 03/17/13 0615  HGB 11.5* 10.2*  HCT 34.1* 30.4*    Assessment/Plan: Status post Cesarean section. Doing well postoperatively.  Continue current care.  CURTIS,CAROL G 03/17/2013, 8:55 AM

## 2013-03-18 NOTE — Progress Notes (Signed)
Subjective: Postpartum Day 2: Cesarean Delivery Patient reports incisional pain, tolerating PO, + flatus, + BM and no problems voiding.    Objective: Vital signs in last 24 hours: Temp:  [97.8 F (36.6 C)-98.5 F (36.9 C)] 98.5 F (36.9 C) (08/16 0535) Pulse Rate:  [67-68] 67 (08/16 0535) Resp:  [18] 18 (08/16 0535) BP: (100-124)/(60-81) 124/81 mmHg (08/16 0535)  Physical Exam:  General: alert, cooperative, appears stated age and mild distress Lochia: appropriate Uterine Fundus: firm Incision: healing well DVT Evaluation: No evidence of DVT seen on physical exam.   Recent Labs  03/16/13 0936 03/17/13 0615  HGB 11.5* 10.2*  HCT 34.1* 30.4*    Assessment/Plan: Status post Cesarean section. Doing well postoperatively.  Continue current care.  Larence Thone C 03/18/2013, 11:23 AM

## 2013-03-19 MED ORDER — IBUPROFEN 100 MG/5ML PO SUSP: 600.0000 mg | Freq: Four times a day (QID) | ORAL | Status: DC

## 2013-03-19 MED ORDER — OXYCODONE HCL 5 MG/5ML PO SOLN: 5.0000 mg | ORAL | Status: DC | PRN

## 2013-03-19 NOTE — Discharge Summary (Signed)
Obstetric Discharge Summary Reason for Admission: Labor Prenatal Procedures: none Intrapartum Procedures: cesarean: low cervical, transverse Postpartum Procedures: none Complications-Operative and Postpartum: none Hemoglobin  Date Value Range Status  03/17/2013 10.2* 12.0 - 15.0 g/dL Final     HCT  Date Value Range Status  03/17/2013 30.4* 36.0 - 46.0 % Final    Physical Exam:  General: alert, cooperative, appears stated age and no distress Lochia: appropriate Uterine Fundus: firm Incision: healing well DVT Evaluation: No evidence of DVT seen on physical exam.  Discharge Diagnoses: Term Pregnancy-delivered  Discharge Information: Date: 03/19/2013 Activity: pelvic rest Diet: routine Medications: Ibuprofen and Percocet Condition: stable Instructions: refer to practice specific booklet Discharge to: home   Newborn Data: Live born female  Birth Weight: 7 lb 15.2 oz (3605 g) APGAR: 8, 9  Home with mother.  Alexandria Cherry C 03/19/2013, 10:09 AM

## 2013-03-23 ENCOUNTER — Inpatient Hospital Stay (HOSPITAL_COMMUNITY)
Admission: AD | Admit: 2013-03-23 | Discharge: 2013-03-23 | Disposition: A | Discharge status: Home / Self Care | Payer: BC Managed Care – PPO | Source: Ambulatory Visit | Attending: Obstetrics and Gynecology | Admitting: Obstetrics and Gynecology

## 2013-03-23 ENCOUNTER — Inpatient Hospital Stay (HOSPITAL_COMMUNITY)
Admission: AD | Admit: 2013-03-23 | Discharge: 2013-03-24 | Disposition: A | Discharge status: Home / Self Care | Payer: BC Managed Care – PPO | Source: Ambulatory Visit | Attending: Obstetrics and Gynecology | Admitting: Obstetrics and Gynecology

## 2013-03-23 ENCOUNTER — Encounter (HOSPITAL_COMMUNITY): Payer: Self-pay | Admitting: *Deleted

## 2013-03-23 DIAGNOSIS — N390 Urinary tract infection, site not specified: Secondary | ICD-10-CM | POA: Insufficient documentation

## 2013-03-23 DIAGNOSIS — IMO0002 Reserved for concepts with insufficient information to code with codable children: Secondary | ICD-10-CM | POA: Insufficient documentation

## 2013-03-23 DIAGNOSIS — R112 Nausea with vomiting, unspecified: Secondary | ICD-10-CM | POA: Insufficient documentation

## 2013-03-23 DIAGNOSIS — R109 Unspecified abdominal pain: Secondary | ICD-10-CM | POA: Insufficient documentation

## 2013-03-23 DIAGNOSIS — R5381 Other malaise: Secondary | ICD-10-CM

## 2013-03-23 DIAGNOSIS — R110 Nausea: Secondary | ICD-10-CM | POA: Insufficient documentation

## 2013-03-23 DIAGNOSIS — O2390 Unspecified genitourinary tract infection in pregnancy, unspecified trimester: Secondary | ICD-10-CM | POA: Insufficient documentation

## 2013-03-23 DIAGNOSIS — O8620 Urinary tract infection following delivery, unspecified: Secondary | ICD-10-CM

## 2013-03-23 LAB — URINALYSIS, ROUTINE W REFLEX MICROSCOPIC
Bilirubin Urine: NEGATIVE
Ketones, ur: NEGATIVE mg/dL
Nitrite: NEGATIVE
Protein, ur: NEGATIVE mg/dL
Specific Gravity, Urine: 1.01 (ref 1.005–1.030)
Urobilinogen, UA: 0.2 mg/dL (ref 0.0–1.0)

## 2013-03-23 LAB — CBC WITH DIFFERENTIAL/PLATELET
Basophils Absolute: 0 10*3/uL (ref 0.0–0.1)
Basophils Relative: 1 % (ref 0–1)
Eosinophils Absolute: 0.2 10*3/uL (ref 0.0–0.7)
Eosinophils Relative: 2 % (ref 0–5)
HCT: 35.6 % — ABNORMAL LOW (ref 36.0–46.0)
MCH: 29 pg (ref 26.0–34.0)
MCHC: 33.1 g/dL (ref 30.0–36.0)
Monocytes Absolute: 0.3 10*3/uL (ref 0.1–1.0)
Neutro Abs: 5.8 10*3/uL (ref 1.7–7.7)
RDW: 14 % (ref 11.5–15.5)

## 2013-03-23 LAB — URINE MICROSCOPIC-ADD ON

## 2013-03-23 MED ORDER — CEPHALEXIN 500 MG PO CAPS: 500.0000 mg | ORAL_CAPSULE | Freq: Four times a day (QID) | ORAL | Status: DC

## 2013-03-23 NOTE — MAU Note (Signed)
Patient states he had a repeat cesarean section on 8-14, was scheduled for 8-28. States today she has not felt well, nausea, no appetite, extreme fatigue and dizziness. Slight abdominal cramping. Denies fever, diarrhea or incisional problems.

## 2013-03-23 NOTE — MAU Note (Signed)
Pt reports she can't eat or drink anything due to nausea, not vomiting., s/p c-section 08/14, in MAU earlier today, states she feels worse now.

## 2013-03-23 NOTE — MAU Provider Note (Signed)
History     CSN: [id number]  Arrival date and time: 03/23/13 1552   None     Chief Complaint  Patient presents with  . Post-op Problem  . Nausea  . Dizziness  . Fatigue   HPI Alexandria Cherry is 27 y.o. [id number] presents for "not feeling well".  She went into preterm labor at 37wks gesation and  had a C-Section delivery 8/14.  "Everything was fine until today".  This am she felt very dizzy, no appetite, went back to sleep and sxs returned.  Wanted only water but tried to eat but nauseated.  Denies vomiting.  Nursing infant--going well.  Denies fever, UTI, or depressive sxs.  Has had chills.  Has intermitted vaginal bleeding or abdominal pain.     Past Medical History  Diagnosis Date  . No pertinent past medical history   . Rhinitis recent onset 09/30/11  . Medical history non-contributory     Past Surgical History  Procedure Laterality Date  . Cesarean section  06/2009  . Cesarean section  10/04/2011    Procedure: CESAREAN SECTION;  Surgeon: Leslie Andrea, MD;  Location: [facility];  Service: Gynecology;  Laterality: N/A;  Repeat cesarean section of baby boy  at  0359 APGAR 9/10  . Cesarean section N/A 03/16/2013    Procedure: CESAREAN SECTION;  Surgeon: Juluis Mire, MD;  Location: [facility];  Service: Obstetrics;  Laterality: N/A;    Family History  Problem Relation Age of Onset  . Anesthesia problems Neg Hx     History  Substance Use Topics  . Smoking status: Never Smoker   . Smokeless tobacco: Not on file  . Alcohol Use: No    Allergies: No Known Allergies  Prescriptions prior to admission  Medication Sig Dispense Refill  . ibuprofen (ADVIL,MOTRIN) 100 MG/5ML suspension Take 30 mL (600 mg total) by mouth every 6 (six) hours.  150 mL  1  . oxyCODONE (ROXICODONE) 5 MG/5ML solution Take 5-10 mL (5-10 mg total) by mouth every 4 (four) hours as needed.  200 mL  0  . Prenatal Vit-Min-FA-Fish Oil ([employer] PRENATAL GUMMY PO) Take 2 tablets by mouth daily.      . simethicone  (MYLICON) 80 MG chewable tablet Chew 80 mg by mouth daily as needed for flatulence.        Review of Systems  Constitutional: Positive for chills. Negative for fever.  Gastrointestinal: Positive for nausea. Negative for vomiting, abdominal pain, diarrhea and constipation.  Genitourinary: Negative for dysuria, urgency, frequency and hematuria.       Small amount of vaginal bleeding  Neurological: Positive for dizziness.   Physical Exam   Blood pressure 143/81, pulse 53, temperature 98.2 F (36.8 C), resp. rate 16, SpO2 100.00%, currently breastfeeding.  Physical Exam  Constitutional: She is oriented to person, place, and time. She appears well-developed and well-nourished. No distress.  HENT:  Head: Normocephalic.  Neck: Normal range of motion.  Cardiovascular: Normal rate.   Respiratory: Effort normal.  GI: Soft. She exhibits no distension and no mass. There is no tenderness. There is no rebound and no guarding.  C-Section incision appears to be without redness, drainage or unexpected tenderness.  Surgical dressing intact.  Genitourinary: There is breast discharge (milk). No breast swelling, tenderness or bleeding. There is no rash, tenderness or lesion on the right labia. There is no rash, tenderness or lesion on the left labia. Uterus is enlarged. Uterus is not tender. Cervix exhibits no motion tenderness, no discharge  and no friability. There is bleeding (small amount of pink blood without clot or active bleeding) around the vagina. No tenderness around the vagina.  Neurological: She is alert and oriented to person, place, and time.  Skin: Skin is warm and dry.  Psychiatric: She has a normal mood and affect. Her behavior is normal.   Results for orders placed during the hospital encounter of 03/23/13 (from the past 24 hour(s))  URINALYSIS, ROUTINE W REFLEX MICROSCOPIC     Status: Abnormal   Collection Time    03/23/13  4:05 PM      Result Value Range   Color, Urine YELLOW  YELLOW    APPearance CLEAR  CLEAR   Specific Gravity, Urine 1.010  1.005 - 1.030   pH 7.0  5.0 - 8.0   Glucose, UA NEGATIVE  NEGATIVE mg/dL   Hgb urine dipstick LARGE (*) NEGATIVE   Bilirubin Urine NEGATIVE  NEGATIVE   Ketones, ur NEGATIVE  NEGATIVE mg/dL   Protein, ur NEGATIVE  NEGATIVE mg/dL   Urobilinogen, UA 0.2  0.0 - 1.0 mg/dL   Nitrite NEGATIVE  NEGATIVE   Leukocytes, UA MODERATE (*) NEGATIVE  URINE MICROSCOPIC-ADD ON     Status: Abnormal   Collection Time    03/23/13  4:05 PM      Result Value Range   Squamous Epithelial / LPF FEW (*) RARE   WBC, UA 11-20  <3 WBC/hpf   RBC / HPF 3-6  <3 RBC/hpf   Bacteria, UA MANY (*) RARE   Urine-Other MUCOUS PRESENT    CBC WITH DIFFERENTIAL     Status: Abnormal   Collection Time    03/23/13  4:28 PM      Result Value Range   WBC 8.2  4.0 - 10.5 K/uL   RBC 4.07  3.87 - 5.11 MIL/uL   Hemoglobin 11.8 (*) 12.0 - 15.0 g/dL   HCT 45.4 (*) 09.8 - 11.9 %   MCV 87.5  78.0 - 100.0 fL   MCH 29.0  26.0 - 34.0 pg   MCHC 33.1  30.0 - 36.0 g/dL   RDW 14.7  82.9 - 56.2 %   Platelets 336  150 - 400 K/uL   Neutrophils Relative % 72  43 - 77 %   Neutro Abs 5.8  1.7 - 7.7 K/uL   Lymphocytes Relative 22  12 - 46 %   Lymphs Abs 1.8  0.7 - 4.0 K/uL   Monocytes Relative 4  3 - 12 %   Monocytes Absolute 0.3  0.1 - 1.0 K/uL   Eosinophils Relative 2  0 - 5 %   Eosinophils Absolute 0.2  0.0 - 0.7 K/uL   Basophils Relative 1  0 - 1 %   Basophils Absolute 0.0  0.0 - 0.1 K/uL   MAU Course  Procedures  Urine culture pending  MDM Called Dr. Rana Snare to report MSE, labs and physical exam.  Order given to begin Keflex 500mg  qid X 1 week.  Call for worsening sxs  Assessment and Plan  A:  Post C-Section delivery with malaise, nausea      Bacturia  P:  Rx. For Keflex 500mg  qid X 1 qweek     Encouraged to stay well hydrated    Tylenol as needed     Call MD for worsening sxs     Keep scheduled appointment for tomorrow   Matt Holmes 03/23/2013, 5:39 PM

## 2013-03-24 ENCOUNTER — Encounter (HOSPITAL_COMMUNITY): Payer: Self-pay | Admitting: *Deleted

## 2013-03-24 DIAGNOSIS — O2390 Unspecified genitourinary tract infection in pregnancy, unspecified trimester: Secondary | ICD-10-CM

## 2013-03-24 DIAGNOSIS — N390 Urinary tract infection, site not specified: Secondary | ICD-10-CM

## 2013-03-24 LAB — URINE CULTURE: Colony Count: 40000

## 2013-03-24 MED ORDER — DEXTROSE 5 % IV SOLN
1.0000 g | Freq: Once | INTRAVENOUS | Status: AC
  Administered 2013-03-24: 1 g via INTRAVENOUS
  Filled 2013-03-24: qty 10

## 2013-03-24 MED ORDER — LACTATED RINGERS IV BOLUS (SEPSIS): 1000.0000 mL | Freq: Once | INTRAVENOUS | Status: DC

## 2013-03-24 MED ORDER — SODIUM CHLORIDE 0.9 % IV SOLN
Freq: Once | INTRAVENOUS | Status: AC
  Administered 2013-03-24: 02:00:00 via INTRAVENOUS

## 2013-03-24 MED ORDER — PROMETHAZINE HCL 25 MG/ML IJ SOLN
25.0000 mg | Freq: Once | INTRAMUSCULAR | Status: AC
  Administered 2013-03-24: 25 mg via INTRAVENOUS
  Filled 2013-03-24: qty 1

## 2013-03-24 NOTE — MAU Note (Addendum)
PT SAYS SHE DELIVERED  C/S-   ON 8-14-  BY DR MCCOMB. THEN WENT HOME ON Sunday.    SAYS SHE STARTED FEELING BAD THIS AM-   NO FEVER,  NAUSEA, DIZZY,   LOOSE BM.   BREASTFEEDING.    WAS IN MAU THIS AM- GIVE ANTX-  PT SAYS SHE CANNOT SWOLLOW  CAPSULES- SO SHE OPENED IT AND PUT IT IN WATER-  MADE SICK- DID NOT VOMIT.  SAYS INCISION SORE- NL,  PASSING GAS  AND DOESN'T GET MUCH SLEEP.,  BREAST SORE.

## 2013-03-24 NOTE — MAU Provider Note (Signed)
History     CSN: [id number]  Arrival date and time: 03/23/13 2307   First Provider Initiated Contact with Patient 03/24/13 0047      Chief Complaint  Patient presents with  . Nausea   HPI  Alexandria Cherry is a 27 y.o. [id number] who is here today after being seen this afternoon. She states that she cannot keep the medication down to help with the UTI as she is having nausea and vomiting from the meds. She would like to have a "shot" or something instead of having to take the antibiotics.   Past Medical History  Diagnosis Date  . No pertinent past medical history   . Rhinitis recent onset 09/30/11  . Medical history non-contributory     Past Surgical History  Procedure Laterality Date  . Cesarean section  06/2009  . Cesarean section  10/04/2011    Procedure: CESAREAN SECTION;  Surgeon: Leslie Andrea, MD;  Location: [facility];  Service: Gynecology;  Laterality: N/A;  Repeat cesarean section of baby boy  at  0359 APGAR 9/10  . Cesarean section N/A 03/16/2013    Procedure: CESAREAN SECTION;  Surgeon: Juluis Mire, MD;  Location: [facility];  Service: Obstetrics;  Laterality: N/A;    Family History  Problem Relation Age of Onset  . Anesthesia problems Neg Hx     History  Substance Use Topics  . Smoking status: Never Smoker   . Smokeless tobacco: Not on file  . Alcohol Use: No    Allergies: No Known Allergies  Prescriptions prior to admission  Medication Sig Dispense Refill  . cephALEXin (KEFLEX) 500 MG capsule Take 1 capsule (500 mg total) by mouth 4 (four) times daily.  40 capsule  0  . ibuprofen (ADVIL,MOTRIN) 100 MG/5ML suspension Take 30 mL (600 mg total) by mouth every 6 (six) hours.  150 mL  1  . oxyCODONE (ROXICODONE) 5 MG/5ML solution Take 5-10 mL (5-10 mg total) by mouth every 4 (four) hours as needed.  200 mL  0  . Prenatal Vit-Min-FA-Fish Oil ([employer] PRENATAL GUMMY PO) Take 2 tablets by mouth daily.      . simethicone (MYLICON) 80 MG chewable tablet Chew 80 mg by  mouth daily as needed for flatulence.        ROS Physical Exam   Blood pressure 127/95, pulse 72, temperature 98 F (36.7 C), temperature source Oral, resp. rate 16, height 5\' 6"  (1.676 m), weight 82.555 kg (182 lb), SpO2 100.00%.  Physical Exam  Nursing note and vitals reviewed. Constitutional: She is oriented to person, place, and time. She appears well-developed and well-nourished. No distress.  Cardiovascular: Normal rate.   Respiratory: Effort normal.  GI: Soft. Bowel sounds are normal. There is no tenderness.  Neurological: She is alert and oriented to person, place, and time.  Skin: Skin is warm and dry.  Psychiatric: She has a normal mood and affect.    MAU Course  Procedures  Results for orders placed during the hospital encounter of 03/23/13 (from the past 24 hour(s))  URINALYSIS, ROUTINE W REFLEX MICROSCOPIC     Status: Abnormal   Collection Time    03/23/13  4:05 PM      Result Value Range   Color, Urine YELLOW  YELLOW   APPearance CLEAR  CLEAR   Specific Gravity, Urine 1.010  1.005 - 1.030   pH 7.0  5.0 - 8.0   Glucose, UA NEGATIVE  NEGATIVE mg/dL   Hgb urine dipstick LARGE (*)  NEGATIVE   Bilirubin Urine NEGATIVE  NEGATIVE   Ketones, ur NEGATIVE  NEGATIVE mg/dL   Protein, ur NEGATIVE  NEGATIVE mg/dL   Urobilinogen, UA 0.2  0.0 - 1.0 mg/dL   Nitrite NEGATIVE  NEGATIVE   Leukocytes, UA MODERATE (*) NEGATIVE  URINE MICROSCOPIC-ADD ON     Status: Abnormal   Collection Time    03/23/13  4:05 PM      Result Value Range   Squamous Epithelial / LPF FEW (*) RARE   WBC, UA 11-20  <3 WBC/hpf   RBC / HPF 3-6  <3 RBC/hpf   Bacteria, UA MANY (*) RARE   Urine-Other MUCOUS PRESENT    CBC WITH DIFFERENTIAL     Status: Abnormal   Collection Time    03/23/13  4:28 PM      Result Value Range   WBC 8.2  4.0 - 10.5 K/uL   RBC 4.07  3.87 - 5.11 MIL/uL   Hemoglobin 11.8 (*) 12.0 - 15.0 g/dL   HCT 16.1 (*) 09.6 - 04.5 %   MCV 87.5  78.0 - 100.0 fL   MCH 29.0  26.0 - 34.0  pg   MCHC 33.1  30.0 - 36.0 g/dL   RDW 40.9  81.1 - 91.4 %   Platelets 336  150 - 400 K/uL   Neutrophils Relative % 72  43 - 77 %   Neutro Abs 5.8  1.7 - 7.7 K/uL   Lymphocytes Relative 22  12 - 46 %   Lymphs Abs 1.8  0.7 - 4.0 K/uL   Monocytes Relative 4  3 - 12 %   Monocytes Absolute 0.3  0.1 - 1.0 K/uL   Eosinophils Relative 2  0 - 5 %   Eosinophils Absolute 0.2  0.0 - 0.7 K/uL   Basophils Relative 1  0 - 1 %   Basophils Absolute 0.0  0.0 - 0.1 K/uL    Assessment and Plan   1. Postpartum UTI    Will stop Keflex now FU with the office as needed   Tawnya Crook 03/24/2013, 12:51 AM

## 2013-03-28 ENCOUNTER — Other Ambulatory Visit (HOSPITAL_COMMUNITY): Payer: BC Managed Care – PPO

## 2013-03-30 ENCOUNTER — Encounter (HOSPITAL_COMMUNITY): Payer: Self-pay

## 2013-03-30 ENCOUNTER — Inpatient Hospital Stay (HOSPITAL_COMMUNITY): Admit: 2013-03-30 | Payer: Self-pay | Admitting: Obstetrics and Gynecology

## 2013-03-30 SURGERY — Surgical Case
Anesthesia: Regional

## 2014-05-15 ENCOUNTER — Other Ambulatory Visit: Payer: Self-pay | Admitting: Obstetrics and Gynecology

## 2014-05-16 LAB — CYTOLOGY - PAP

## 2014-06-04 ENCOUNTER — Encounter (HOSPITAL_COMMUNITY): Payer: Self-pay | Admitting: *Deleted

## 2015-08-26 ENCOUNTER — Ambulatory Visit: Payer: Medicaid Other | Admitting: Primary Care

## 2015-09-10 ENCOUNTER — Ambulatory Visit: Payer: Medicaid Other | Admitting: Primary Care

## 2015-09-10 ENCOUNTER — Ambulatory Visit (INDEPENDENT_AMBULATORY_CARE_PROVIDER_SITE_OTHER): Payer: 59 | Admitting: Primary Care

## 2015-09-10 ENCOUNTER — Encounter: Payer: Self-pay | Admitting: Primary Care

## 2015-09-10 DIAGNOSIS — R42 Dizziness and giddiness: Secondary | ICD-10-CM | POA: Insufficient documentation

## 2015-09-10 DIAGNOSIS — Z8249 Family history of ischemic heart disease and other diseases of the circulatory system: Secondary | ICD-10-CM

## 2015-09-10 NOTE — Assessment & Plan Note (Signed)
Intermittent for years. Most recent episode 2 months ago lasted 2 days. FH of aneurysm, likely low probability for patient, but would be reasonable to ensure with CT head given dizziness. CT scan ordered, Suspect mostly vertigo as cause. Discussed etiology of vertigo and will have her trial OTC Meclizine 1/2 tablet. Will also have her increase water consumption as she is drinking little water daily. If no improvement in symptoms then will consider sending to ENT for further evaluation.  Neuro exam unremarkable.

## 2015-09-10 NOTE — Progress Notes (Signed)
Subjective:    Patient ID: Alexandria Cherry, female    DOB: 1986/04/03, 30 y.o.   MRN: [id number]  HPI  Ms. Alexandria Cherry is a 30 year old female who presents today to establish care and discuss the problems mentioned below. Will obtain old records. She follows with GYN annually. Her last physical was years ago.  1) Dizziness: Present intermittently for the past several years. Last month she had an episode that lasted for 2 days. She describes her dizziness as a sensation of the room spinning. She thought the dizziness was caused by possible pregnancy, but her test was negative. These dizzy episodes will occur at rest mostly.   Denies history of seasonal allergies and syncope. She did have a syncopal episode at age 30 which was attributed to dehydration. She will currently drink 1 bottle of water daily, soda, and coffee. Her grandmother passed away from a brain aneurysm so she is concerned she may be at risk. She does endorse headaches that occur once every 6 months, located to temporal region. Denies changes in vision, mental status, gait.   Review of Systems  Constitutional: Negative for fever and chills.  HENT: Negative for congestion, ear pain and rhinorrhea.   Respiratory: Negative for cough and shortness of breath.   Cardiovascular: Negative for chest pain.  Allergic/Immunologic: Negative for environmental allergies.  Neurological: Positive for dizziness. Negative for numbness and headaches.       Past Medical History  Diagnosis Date  . Rhinitis recent onset 09/30/11  . Vertigo     Social History   Social History  . Marital Status: Married    Spouse Name: N/A  . Number of Children: N/A  . Years of Education: N/A   Occupational History  . Not on file.   Social History Main Topics  . Smoking status: Never Smoker   . Smokeless tobacco: Not on file  . Alcohol Use: No  . Drug Use: No  . Sexual Activity: Yes    Birth Control/ Protection: None   Other Topics Concern  . Not on  file   Social History Narrative   Married   3 children   Works as an Research scientist (medical).   Enjoys being outdoors, spending time with family.     Past Surgical History  Procedure Laterality Date  . Cesarean section  06/2009  . Cesarean section  10/04/2011    Procedure: CESAREAN SECTION;  Surgeon: Leslie Andrea, MD;  Location: [facility];  Service: Gynecology;  Laterality: N/A;  Repeat cesarean section of baby boy  at  0359 APGAR 9/10  . Cesarean section N/A 03/16/2013    Procedure: CESAREAN SECTION;  Surgeon: Juluis Mire, MD;  Location: [facility];  Service: Obstetrics;  Laterality: N/A;    Family History  Problem Relation Age of Onset  . Hypertension Mother     No Known Allergies  No current outpatient prescriptions on file prior to visit.   No current facility-administered medications on file prior to visit.    BP 118/76 mmHg  Pulse 59  Temp(Src) 97.7 F (36.5 C) (Oral)  Ht  (1.702 m)  Wt 174 lb 1.9 oz (78.98 kg)  BMI 27.26 kg/m2  SpO2 98%  LMP 08/08/2015    Objective:   Physical Exam  Constitutional: She is oriented to person, place, and time. She appears well-nourished.  Eyes: EOM are normal. Pupils are equal, round, and reactive to light.  Cardiovascular: Normal rate and regular rhythm.   Pulmonary/Chest: Effort  normal and breath sounds normal.  Neurological: She is alert and oriented to person, place, and time. No cranial nerve deficit.  Skin: Skin is warm and dry.          Assessment & Plan:

## 2015-09-10 NOTE — Patient Instructions (Addendum)
Your dizziness sounds like Vertigo. This is dizziness that is typically related to an inner ear disorder.   Try Meclizine. This is a medication that can help with dizziness that is purchased over the counter. Start by taking 1/2 tablet at a time as this medication may make you drowsy.  Please call me if you develop severe headaches, changes in vision, or the dizziness does not improve with the Meclizine.   Stop by the front desk and speak with either Shirlee Limerick or [place] regarding the CT scan of your head.  It was a pleasure to meet you today! Please don't hesitate to call me with any questions. Welcome to [facility]!  Vertigo Vertigo means you feel like you or your surroundings are moving when they are not. Vertigo can be dangerous if it occurs when you are at work, driving, or performing difficult activities.  CAUSES  Vertigo occurs when there is a conflict of signals sent to your brain from the visual and sensory systems in your body. There are many different causes of vertigo, including:  Infections, especially in the inner ear.  A bad reaction to a drug or misuse of alcohol and medicines.  Withdrawal from drugs or alcohol.  Rapidly changing positions, such as lying down or rolling over in bed.  A migraine headache.  Decreased blood flow to the brain.  Increased pressure in the brain from a head injury, infection, tumor, or bleeding. SYMPTOMS  You may feel as though the world is spinning around or you are falling to the ground. Because your balance is upset, vertigo can cause nausea and vomiting. You may have involuntary eye movements (nystagmus). DIAGNOSIS  Vertigo is usually diagnosed by physical exam. If the cause of your vertigo is unknown, your caregiver may perform imaging tests, such as an MRI scan (magnetic resonance imaging). TREATMENT  Most cases of vertigo resolve on their own, without treatment. Depending on the cause, your caregiver may prescribe certain medicines. If  your vertigo is related to body position issues, your caregiver may recommend movements or procedures to correct the problem. In rare cases, if your vertigo is caused by certain inner ear problems, you may need surgery. HOME CARE INSTRUCTIONS   Follow your caregiver's instructions.  Avoid driving.  Avoid operating heavy machinery.  Avoid performing any tasks that would be dangerous to you or others during a vertigo episode.  Tell your caregiver if you notice that certain medicines seem to be causing your vertigo. Some of the medicines used to treat vertigo episodes can actually make them worse in some people. SEEK IMMEDIATE MEDICAL CARE IF:   Your medicines do not relieve your vertigo or are making it worse.  You develop problems with talking, walking, weakness, or using your arms, hands, or legs.  You develop severe headaches.  Your nausea or vomiting continues or gets worse.  You develop visual changes.  A family member notices behavioral changes.  Your condition gets worse. MAKE SURE YOU:  Understand these instructions.  Will watch your condition.  Will get help right away if you are not doing well or get worse.   This information is not intended to replace advice given to you by your health care provider. Make sure you discuss any questions you have with your health care provider.   Document Released: 04/29/2005 Document Revised: 10/12/2011 Document Reviewed: 11/12/2014 Elsevier Interactive Patient Education [employer]

## 2015-09-17 ENCOUNTER — Ambulatory Visit (INDEPENDENT_AMBULATORY_CARE_PROVIDER_SITE_OTHER)
Admission: RE | Admit: 2015-09-17 | Discharge: 2015-09-17 | Disposition: A | Discharge status: Home / Self Care | Payer: 59 | Source: Ambulatory Visit | Attending: Primary Care | Admitting: Primary Care

## 2015-09-17 DIAGNOSIS — R42 Dizziness and giddiness: Secondary | ICD-10-CM | POA: Diagnosis not present

## 2015-09-17 DIAGNOSIS — Z8249 Family history of ischemic heart disease and other diseases of the circulatory system: Secondary | ICD-10-CM | POA: Diagnosis not present

## 2015-09-17 MED ORDER — IOHEXOL 350 MG/ML SOLN
80.0000 mL | Freq: Once | INTRAVENOUS | Status: AC | PRN
  Administered 2015-09-17: 80 mL via INTRAVENOUS

## 2016-12-31 IMAGING — CT CT ANGIO HEAD
3 of 13 series · 12 of 47 positions shown · IV contrast (omnipaque)
Comparison: Radiata documented O cut correct area her low headache
as chronic

CLINICAL DATA: Family history of brain aneurysm. Intermittent
dizziness.

EXAM:
CT ANGIOGRAPHY HEAD
TECHNIQUE: Multidetector CT imaging of the head was performed using the
standard protocol during bolus administration of intravenous
contrast. Multiplanar CT image reconstructions and MIPs were
obtained to evaluate the vascular anatomy.
CONTRAST:  80mL OMNIPAQUE IOHEXOL 350 MG/ML SOLN

[Series 7: cta brain 2.0 i30f 3 · axial · 0.36mm/px · z∈[+1342,+1390]mm · 2 of 73 slices shown]
[im 25/73  brain]
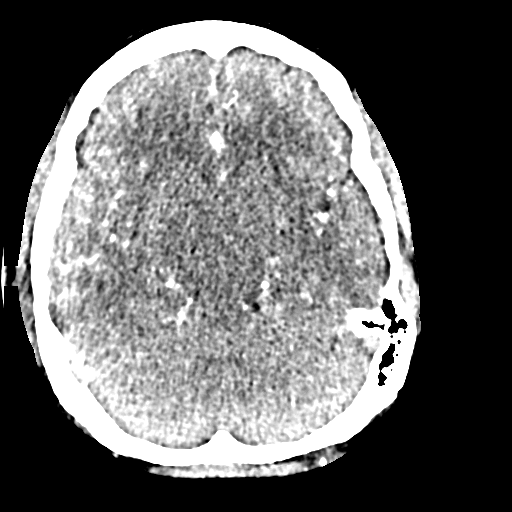
[im 49/73  brain]
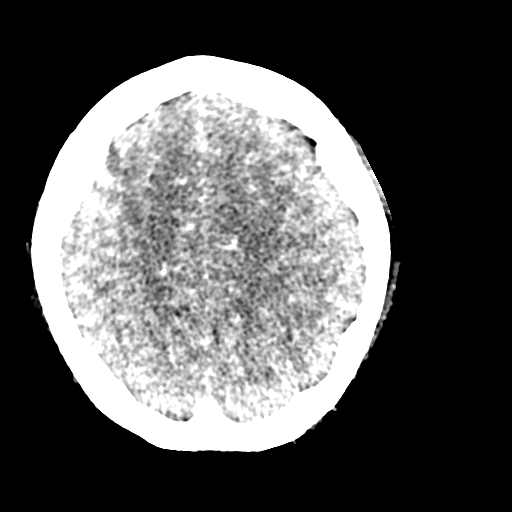

[Series 8: thin axial · axial · 0.36mm/px · z∈[+1312,+1420]mm · 7 of 146 slices shown]
[im 19/146  brain]
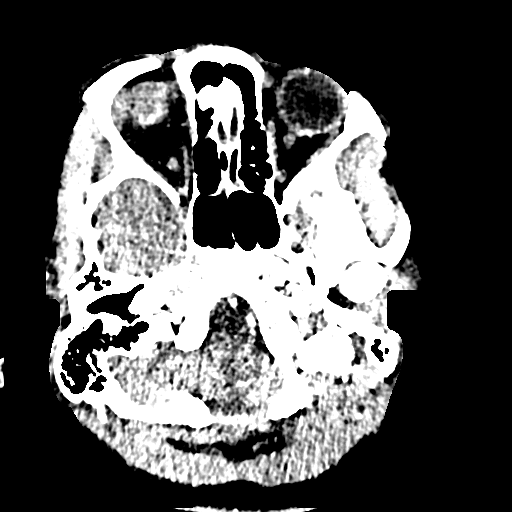
[im 37/146  bone]
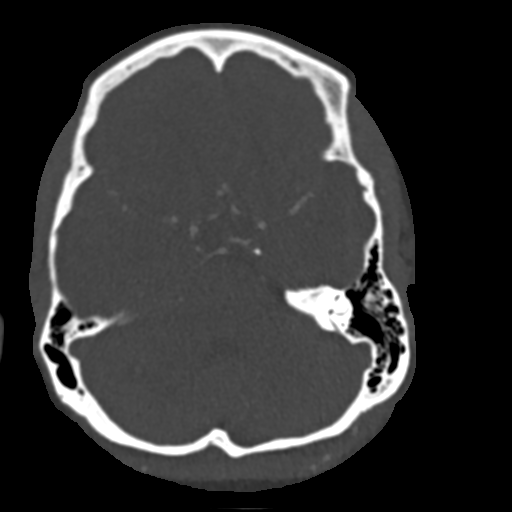
[im 55/146  brain]
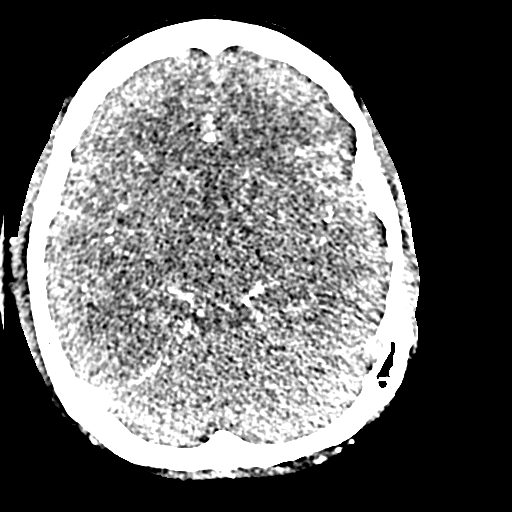
[im 73/146  bone]
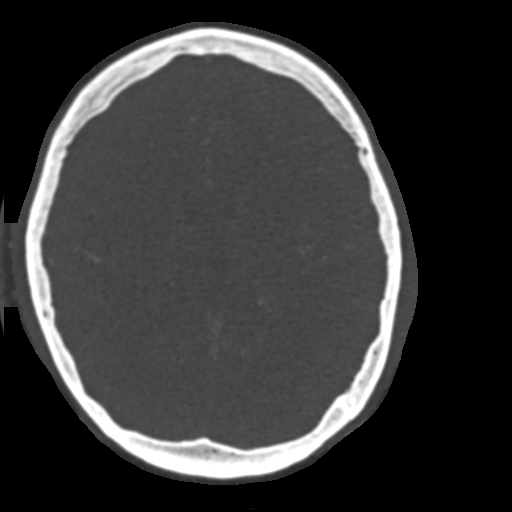
[im 91/146  brain]
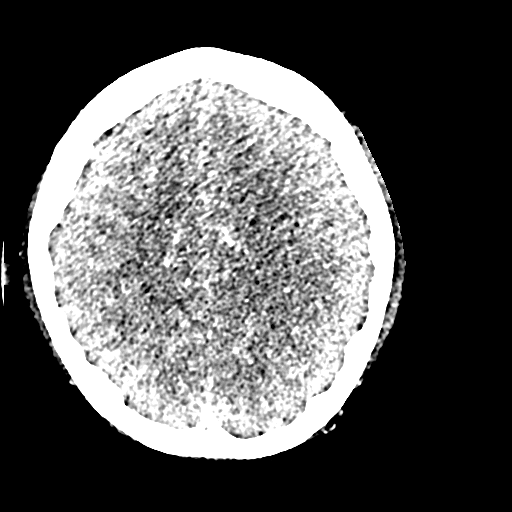
[im 109/146  bone]
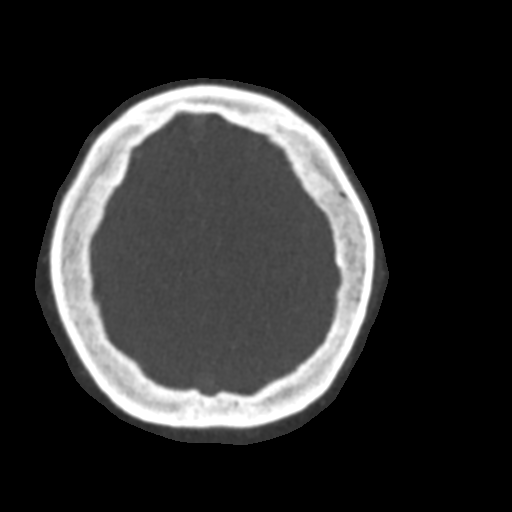
[im 127/146  brain]
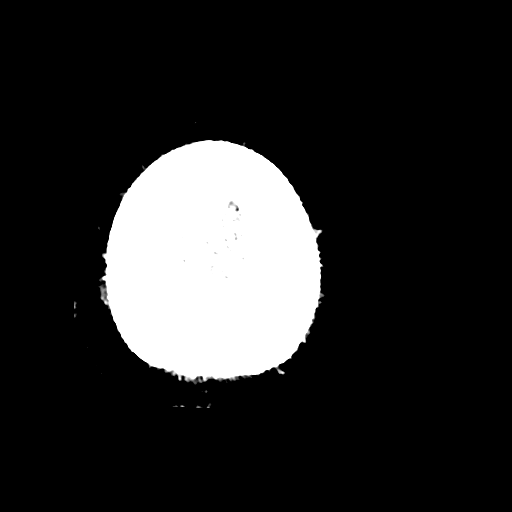

[Series 602: 1x1 cor · coronal · 0.36mm/px · 3 of 164 slices shown]
[im 47/164  brain]
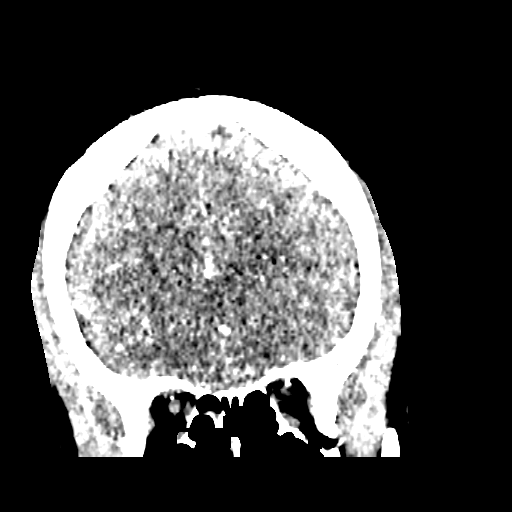
[im 70/164  brain]
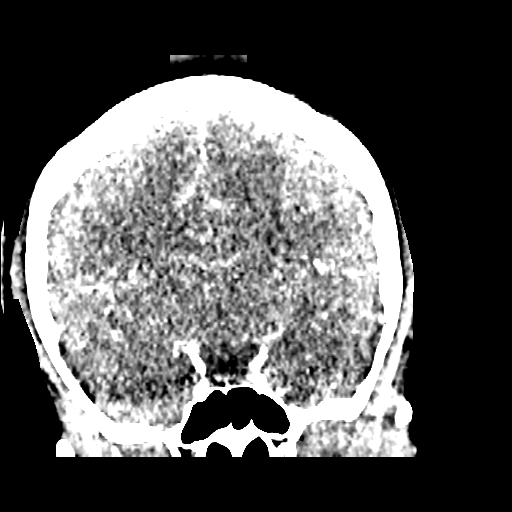
[im 94/164  brain]
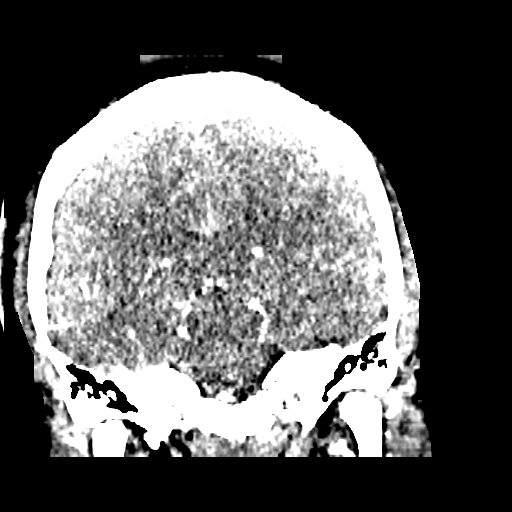

[12 of 47 positions shown; findings below may reference images not displayed]

FINDINGS: CT HEAD

Brain: No acute infarct, hemorrhage, or mass lesion is present. The
basal ganglia are intact. The insular ribbon is normal bilaterally.
No significant extra-axial fluid collection is present.

Calvarium and skull base: Within normal limits

Paranasal sinuses: Minimal mucosal thickening is present in the
posterior ethmoid air cells. The mastoid air cells are clear. The
paranasal sinuses are otherwise within normal limits.

Orbits: Negative

CTA HEAD

Anterior circulation: The internal carotid arteries are within
normal limits from the high cervical segments through the ICA
termini bilaterally. The ICA termini are within normal limits
bilaterally. The A1 and M1 segments are unremarkable. The MCA
bifurcations are within normal limits. Irregularity of distal small
vessels is felt to be artifactual.

Posterior circulation: The left vertebral artery is the dominant
vessel. The PICA origins are visualized and normal. The
vertebrobasilar junction is within normal limits. Both posterior
cerebral arteries originate from the basilar tip. A prominent right
posterior communicating artery is noted. This is dominant to the
right P1 segment. The PCA branch vessels are intact. Irregularity of
distal small vessels is felt to be artifactual.

Venous sinuses: The dural sinuses are patent. The right transverse
sinus is dominant. The straight sinus and deep cerebral veins are
intact.

Anatomic variants: None

Delayed phase:The postcontrast images demonstrate no pathologic
enhancement.
IMPRESSION: Negative CTA circle of Willis.

## 2017-01-26 LAB — OB RESULTS CONSOLE HEPATITIS B SURFACE ANTIGEN: HEP B S AG: NEGATIVE

## 2017-01-26 LAB — OB RESULTS CONSOLE RUBELLA ANTIBODY, IGM: RUBELLA: IMMUNE

## 2017-01-26 LAB — OB RESULTS CONSOLE GC/CHLAMYDIA
Chlamydia: NEGATIVE
GC PROBE AMP, GENITAL: NEGATIVE

## 2017-01-26 LAB — OB RESULTS CONSOLE ANTIBODY SCREEN: ANTIBODY SCREEN: NEGATIVE

## 2017-01-26 LAB — OB RESULTS CONSOLE ABO/RH: RH Type: POSITIVE

## 2017-01-26 LAB — OB RESULTS CONSOLE HIV ANTIBODY (ROUTINE TESTING): HIV: NONREACTIVE

## 2017-01-26 LAB — OB RESULTS CONSOLE RPR: RPR: NONREACTIVE

## 2017-07-21 ENCOUNTER — Encounter (HOSPITAL_COMMUNITY): Payer: Self-pay | Admitting: *Deleted

## 2017-07-29 ENCOUNTER — Inpatient Hospital Stay (HOSPITAL_COMMUNITY): Payer: 59 | Admitting: Anesthesiology

## 2017-07-29 ENCOUNTER — Encounter (HOSPITAL_COMMUNITY): Payer: Self-pay | Admitting: *Deleted

## 2017-07-29 ENCOUNTER — Encounter (HOSPITAL_COMMUNITY)
Admission: AD | Disposition: A | Discharge status: Home / Self Care | Payer: Self-pay | Source: Ambulatory Visit | Attending: Obstetrics and Gynecology

## 2017-07-29 ENCOUNTER — Inpatient Hospital Stay (HOSPITAL_COMMUNITY)
Admission: AD | Admit: 2017-07-29 | Discharge: 2017-08-01 | DRG: 788 | Disposition: A | Discharge status: Home / Self Care | Payer: 59 | Source: Ambulatory Visit | Attending: Obstetrics and Gynecology | Admitting: Obstetrics and Gynecology

## 2017-07-29 ENCOUNTER — Other Ambulatory Visit: Payer: Self-pay

## 2017-07-29 DIAGNOSIS — Z3A38 38 weeks gestation of pregnancy: Secondary | ICD-10-CM | POA: Diagnosis not present

## 2017-07-29 DIAGNOSIS — Z98891 History of uterine scar from previous surgery: Secondary | ICD-10-CM

## 2017-07-29 DIAGNOSIS — O34211 Maternal care for low transverse scar from previous cesarean delivery: Principal | ICD-10-CM | POA: Diagnosis present

## 2017-07-29 LAB — CBC
HCT: 36.2 % (ref 36.0–46.0)
Hemoglobin: 11.9 g/dL — ABNORMAL LOW (ref 12.0–15.0)
MCH: 29.3 pg (ref 26.0–34.0)
MCHC: 32.9 g/dL (ref 30.0–36.0)
MCV: 89.2 fL (ref 78.0–100.0)
PLATELETS: 267 10*3/uL (ref 150–400)
RBC: 4.06 MIL/uL (ref 3.87–5.11)
RDW: 14.3 % (ref 11.5–15.5)
WBC: 12 10*3/uL — ABNORMAL HIGH (ref 4.0–10.5)

## 2017-07-29 LAB — TYPE AND SCREEN
ABO/RH(D): O POS
Antibody Screen: NEGATIVE

## 2017-07-29 SURGERY — Surgical Case
Anesthesia: Spinal

## 2017-07-29 SURGERY — Surgical Case
Anesthesia: Regional

## 2017-07-29 MED ORDER — ONDANSETRON HCL 4 MG/2ML IJ SOLN
INTRAMUSCULAR | Status: AC
  Filled 2017-07-29: qty 2

## 2017-07-29 MED ORDER — MORPHINE SULFATE (PF) 0.5 MG/ML IJ SOLN
INTRAMUSCULAR | Status: DC | PRN
  Administered 2017-07-29: .2 mg via EPIDURAL
  Administered 2017-07-29: 4.8 mg via INTRAVENOUS

## 2017-07-29 MED ORDER — LACTATED RINGERS IV SOLN
INTRAVENOUS | Status: DC
  Administered 2017-07-29 (×2): via INTRAVENOUS

## 2017-07-29 MED ORDER — FENTANYL CITRATE (PF) 100 MCG/2ML IJ SOLN
INTRAMUSCULAR | Status: DC | PRN
  Administered 2017-07-29 (×2): 50 ug via INTRAVENOUS

## 2017-07-29 MED ORDER — KETOROLAC TROMETHAMINE 30 MG/ML IJ SOLN: 30.0000 mg | Freq: Once | INTRAMUSCULAR | Status: DC | PRN

## 2017-07-29 MED ORDER — PHENYLEPHRINE 8 MG IN D5W 100 ML (0.08MG/ML) PREMIX OPTIME
INJECTION | INTRAVENOUS | Status: DC | PRN
  Administered 2017-07-29: 60 ug/min via INTRAVENOUS

## 2017-07-29 MED ORDER — SODIUM CHLORIDE 0.9 % IR SOLN
Status: DC | PRN
  Administered 2017-07-29: 1

## 2017-07-29 MED ORDER — FAMOTIDINE IN NACL 20-0.9 MG/50ML-% IV SOLN
20.0000 mg | Freq: Once | INTRAVENOUS | Status: AC
  Administered 2017-07-29: 20 mg via INTRAVENOUS
  Filled 2017-07-29: qty 50

## 2017-07-29 MED ORDER — MORPHINE SULFATE (PF) 0.5 MG/ML IJ SOLN
INTRAMUSCULAR | Status: AC
  Filled 2017-07-29: qty 10

## 2017-07-29 MED ORDER — DEXAMETHASONE SODIUM PHOSPHATE 4 MG/ML IJ SOLN
INTRAMUSCULAR | Status: AC
  Filled 2017-07-29: qty 1

## 2017-07-29 MED ORDER — SOD CITRATE-CITRIC ACID 500-334 MG/5ML PO SOLN: 30.0000 mL | ORAL | Status: DC

## 2017-07-29 MED ORDER — BUPIVACAINE IN DEXTROSE 0.75-8.25 % IT SOLN
INTRATHECAL | Status: DC | PRN
  Administered 2017-07-29: 1.5 mL via INTRATHECAL

## 2017-07-29 MED ORDER — PROMETHAZINE HCL 25 MG/ML IJ SOLN: 6.2500 mg | INTRAMUSCULAR | Status: DC | PRN

## 2017-07-29 MED ORDER — MEPERIDINE HCL 25 MG/ML IJ SOLN: 6.2500 mg | INTRAMUSCULAR | Status: DC | PRN

## 2017-07-29 MED ORDER — DEXAMETHASONE SODIUM PHOSPHATE 4 MG/ML IJ SOLN
INTRAMUSCULAR | Status: DC | PRN
  Administered 2017-07-29: 4 mg via INTRAVENOUS

## 2017-07-29 MED ORDER — CEFAZOLIN SODIUM-DEXTROSE 2-4 GM/100ML-% IV SOLN: 2.0000 g | INTRAVENOUS | Status: DC

## 2017-07-29 MED ORDER — OXYTOCIN 10 UNIT/ML IJ SOLN
INTRAMUSCULAR | Status: AC
  Filled 2017-07-29: qty 4

## 2017-07-29 MED ORDER — BUPIVACAINE IN DEXTROSE 0.75-8.25 % IT SOLN
INTRATHECAL | Status: AC
  Filled 2017-07-29: qty 2

## 2017-07-29 MED ORDER — PHENYLEPHRINE 8 MG IN D5W 100 ML (0.08MG/ML) PREMIX OPTIME
INJECTION | INTRAVENOUS | Status: AC
  Filled 2017-07-29: qty 100

## 2017-07-29 MED ORDER — CEFAZOLIN SODIUM-DEXTROSE 2-4 GM/100ML-% IV SOLN
2.0000 g | INTRAVENOUS | Status: AC
  Administered 2017-07-29: 2 g via INTRAVENOUS
  Filled 2017-07-29: qty 100

## 2017-07-29 MED ORDER — SOD CITRATE-CITRIC ACID 500-334 MG/5ML PO SOLN
30.0000 mL | Freq: Once | ORAL | Status: AC
  Administered 2017-07-29: 30 mL via ORAL
  Filled 2017-07-29: qty 15

## 2017-07-29 MED ORDER — FENTANYL CITRATE (PF) 100 MCG/2ML IJ SOLN
INTRAMUSCULAR | Status: AC
  Filled 2017-07-29: qty 2

## 2017-07-29 MED ORDER — CEFAZOLIN SODIUM-DEXTROSE 2-3 GM-%(50ML) IV SOLR
INTRAVENOUS | Status: AC
  Filled 2017-07-29: qty 50

## 2017-07-29 MED ORDER — ONDANSETRON HCL 4 MG/2ML IJ SOLN
INTRAMUSCULAR | Status: DC | PRN
  Administered 2017-07-29: 4 mg via INTRAVENOUS

## 2017-07-29 MED ORDER — LACTATED RINGERS IV SOLN
INTRAVENOUS | Status: DC | PRN
  Administered 2017-07-29: 40 [IU] via INTRAVENOUS

## 2017-07-29 MED ORDER — HYDROMORPHONE HCL 1 MG/ML IJ SOLN
0.2500 mg | INTRAMUSCULAR | Status: DC | PRN
  Administered 2017-07-30 (×2): 0.5 mg via INTRAVENOUS

## 2017-07-29 SURGICAL SUPPLY — 39 items
ADH SKN CLS APL DERMABOND .7 (GAUZE/BANDAGES/DRESSINGS) ×1
APL SKNCLS STERI-STRIP NONHPOA (GAUZE/BANDAGES/DRESSINGS) ×1
BENZOIN TINCTURE PRP APPL 2/3 (GAUZE/BANDAGES/DRESSINGS) ×2 IMPLANT
CHLORAPREP W/TINT 26ML (MISCELLANEOUS) ×3 IMPLANT
CLAMP CORD UMBIL (MISCELLANEOUS) IMPLANT
CLOSURE STERI STRIP 1/2 X4 (GAUZE/BANDAGES/DRESSINGS) ×2 IMPLANT
CLOTH BEACON ORANGE TIMEOUT ST (SAFETY) ×3 IMPLANT
DERMABOND ADVANCED (GAUZE/BANDAGES/DRESSINGS) ×2
DERMABOND ADVANCED .7 DNX12 (GAUZE/BANDAGES/DRESSINGS) ×1 IMPLANT
DRSG OPSITE POSTOP 4X10 (GAUZE/BANDAGES/DRESSINGS) ×3 IMPLANT
DRSG OPSITE POSTOP 4X12 (GAUZE/BANDAGES/DRESSINGS) ×2 IMPLANT
ELECT REM PT RETURN 9FT ADLT (ELECTROSURGICAL) ×3
ELECTRODE REM PT RTRN 9FT ADLT (ELECTROSURGICAL) ×1 IMPLANT
EXTRACTOR VACUUM KIWI (MISCELLANEOUS) IMPLANT
GLOVE BIO SURGEON STRL SZ 6.5 (GLOVE) ×2 IMPLANT
GLOVE BIO SURGEONS STRL SZ 6.5 (GLOVE) ×1
GLOVE BIOGEL PI IND STRL 6.5 (GLOVE) ×1 IMPLANT
GLOVE BIOGEL PI IND STRL 7.0 (GLOVE) ×2 IMPLANT
GLOVE BIOGEL PI INDICATOR 6.5 (GLOVE) ×2
GLOVE BIOGEL PI INDICATOR 7.0 (GLOVE) ×4
GOWN STRL REUS W/TWL LRG LVL3 (GOWN DISPOSABLE) ×6 IMPLANT
KIT ABG SYR 3ML LUER SLIP (SYRINGE) ×3 IMPLANT
NDL HYPO 25X5/8 SAFETYGLIDE (NEEDLE) ×1 IMPLANT
NEEDLE HYPO 25X5/8 SAFETYGLIDE (NEEDLE) ×3 IMPLANT
NS IRRIG 1000ML POUR BTL (IV SOLUTION) ×3 IMPLANT
PACK C SECTION WH (CUSTOM PROCEDURE TRAY) ×3 IMPLANT
PAD OB MATERNITY 4.3X12.25 (PERSONAL CARE ITEMS) ×3 IMPLANT
PENCIL SMOKE EVAC W/HOLSTER (ELECTROSURGICAL) ×3 IMPLANT
RETRACTOR WND ALEXIS 25 LRG (MISCELLANEOUS) IMPLANT
RTRCTR WOUND ALEXIS 25CM LRG (MISCELLANEOUS)
SUT PLAIN 0 NONE (SUTURE) IMPLANT
SUT PLAIN 2 0 (SUTURE) ×3
SUT PLAIN ABS 2-0 CT1 27XMFL (SUTURE) ×1 IMPLANT
SUT VIC AB 0 CT1 36 (SUTURE) ×3 IMPLANT
SUT VIC AB 0 CTX 36 (SUTURE) ×6
SUT VIC AB 0 CTX36XBRD ANBCTRL (SUTURE) ×2 IMPLANT
SUT VIC AB 4-0 PS2 27 (SUTURE) ×3 IMPLANT
TOWEL OR 17X24 6PK STRL BLUE (TOWEL DISPOSABLE) ×3 IMPLANT
TRAY FOLEY BAG SILVER LF 14FR (SET/KITS/TRAYS/PACK) IMPLANT

## 2017-07-29 NOTE — Brief Op Note (Signed)
07/29/2017  11:25 PM  PATIENT:  Alexandria Cherry  [age] y.o. female  PRE-OPERATIVE DIAGNOSIS:  Repeat Cesarean Section in labor  POST-OPERATIVE DIAGNOSIS:  Repeat Cesarean Section in labor  PROCEDURE:  Procedure(s): CESAREAN SECTION (N/A)  SURGEON:  Surgeon(s) and Role:    * Jacquese Cassarino, Madelaine EtienneElise Jennifer, MD - Primary  PHYSICIAN ASSISTANT:   ASSISTANTS: none   ANESTHESIA:   spinal  EBL:  400 mL   BLOOD ADMINISTERED:none  DRAINS: Urinary Catheter (Foley)   LOCAL MEDICATIONS USED:  NONE  SPECIMEN:  Source of Specimen:  placenta  DISPOSITION OF SPECIMEN:  L&D  COUNTS:  YES  TOURNIQUET:  * No tourniquets in log *  DICTATION: .Note written in EPIC  PLAN OF CARE: Admit to inpatient   PATIENT DISPOSITION:  PACU - hemodynamically stable.   Delay start of Pharmacological VTE agent (>24hrs) due to surgical blood loss or risk of bleeding: not applicable

## 2017-07-29 NOTE — MAU Note (Signed)
Pt reports persistent uc's throughout the day

## 2017-07-29 NOTE — Op Note (Signed)
PROCEDURE DATE: 07/29/17  PREOPERATIVE DIAGNOSIS: Intrauterine pregnancy at 38.3 wga, Indication: rCS in labor  POSTOPERATIVE DIAGNOSIS:The same  PROCEDURE: Repeat Low TransverseCesarean Section  SURGEON: Dr. Belva AgeeElise Rashaud Ybarbo  INDICATIONS:This is a 16XW [id number] G4P3003 at 38.3 wga requiring cesarean section secondary to desires repeat. In labor.  Decision made to proceed with rLTCS.The risks of cesarean section discussed with the patient included but were not limited to: bleeding which may require transfusion or reoperation; infection which may require antibiotics; injury to bowel, bladder, ureters or other surrounding organs; injury to the fetus; need for additional procedures including hysterectomy in the event of a life-threatening hemorrhage; placental abnormalities wth subsequent pregnancies, incisional problems, thromboembolic phenomenon and other postoperative/anesthesia complications. The patient agreed with the proposed plan, giving informed consent for the procedure.   FINDINGS: Viable  femaleinfant in vertex presentation,APGARs 9/9, Weight pending, Amniotic fluid clear, Intact placenta, three vessel cord. Grossly normal uterus, ovaries and fallopian tubes.  .  ANESTHESIA: Epidural ESTIMATED BLOOD LOSS: 400 SPECIMENS: Placenta for L&D COMPLICATIONS: None immediate   PROCEDURE IN DETAIL: The patient received intravenous antibiotics (2g Ancef) and had sequential compression devices applied to her lower extremities while in the preoperative area. Shewasthen taken to the operating roomwhere epidural anesthesiawas dosed up to surgical level andwas found to be adequate. She was then placed in a dorsal supine position with a leftward tilt,and prepped and draped in a sterile manner.A foley catheter was placed into her bladder and attached to constant gravity. After an adequate timeout was performed, aPfannenstiel skin incision was made with scalpel and carried through  to the underlying layer of fascia. The fascia was incised in the midline and this incision was extended bilaterally using the Mayo scissors. Kocher clamps were applied to the superior aspect of the fascial incision and the underlying rectus muscles were dissected off bluntly. A similar process was carried out on the inferior aspect of the facial incision. The rectus muscles were separated in the midline bluntly and the peritoneum was entered bluntly. A bladder flap was created sharply and developed bluntly.Atransverse hysterotomy was made with a scalpel and extended bilaterally bluntly. The bladder blade was then removed. The infant was successfully delivered, and cord was clamped and cut and infant was handed over to awaiting neonatology team. Uterine massage was then administered and the placenta delivered intact with three-vessel cord. Cord gases were taken. The uterus was cleared of clot and debris. The hysterotomy was closed with 0 vicryl.A second imbricating suture of 0-vicryl was used to reinforce the incision and aid in hemostasis.The fascia was closed with 0-Vicryl in a running fashion with good restoration of anatomy. The subcutaneus tissue was irrigated and was reapproximated using three interrupted plain gut stitches. The skin was closed with 4-0 Vicryl in a subcuticular fashion.  Final EBL was 400cc (all surgical site and was hemostatic at end of procedure) without any further bleeding on exam.   Pt tolerated the procedure well. All sponge/lap/needle counts were correct X 2. Pt taken to recovery room in stable condition.   Belva AgeeElise Tanielle Emigh MD

## 2017-07-29 NOTE — Anesthesia Procedure Notes (Signed)
Spinal  Patient location during procedure: OR Staffing Anesthesiologist: Nolon Nations, MD Performed: anesthesiologist  Preanesthetic Checklist Completed: patient identified, site marked, surgical consent, pre-op evaluation, timeout performed, IV checked, risks and benefits discussed and monitors and equipment checked Spinal Block Patient position: sitting Prep: site prepped and draped and DuraPrep Patient monitoring: heart rate, continuous pulse ox and blood pressure Approach: midline Location: L3-4 Injection technique: single-shot Needle Needle type: Sprotte  Needle gauge: 24 G Needle length: 9 cm Assessment Sensory level: T6 Additional Notes Expiration date of kit checked and confirmed. Patient tolerated procedure well, without complications.

## 2017-07-29 NOTE — H&P (Signed)
Alexandria Cherry is a 31 y.o. female presenting for labor. H/o CSx3. OB History    Gravida Para Term Preterm AB Living   4 3 3  0 0 3   SAB TAB Ectopic Multiple Live Births   0 0 0 0 3     Past Medical History:  Diagnosis Date  . Rhinitis recent onset 09/30/11  . Vaginal Pap smear, abnormal   . Vertigo    Past Surgical History:  Procedure Laterality Date  . CESAREAN SECTION  06/2009  . CESAREAN SECTION  10/04/2011   Procedure: CESAREAN SECTION;  Surgeon: Leslie AndreaJames E Tomblin II, MD;  Location: [facility];  Service: Gynecology;  Laterality: N/A;  Repeat cesarean section of baby boy  at  0359 APGAR 9/10  . CESAREAN SECTION N/A 03/16/2013   Procedure: CESAREAN SECTION;  Surgeon: Juluis MireJohn S McComb, MD;  Location: [facility];  Service: Obstetrics;  Laterality: N/A;   Family History: family history includes Hypertension in her mother. Social History:  reports that  has never smoked. she has never used smokeless tobacco. She reports that she does not drink alcohol or use drugs.     Maternal Diabetes: No Genetic Screening: Declined Maternal Ultrasounds/Referrals: Normal Fetal Ultrasounds or other Referrals:  None Maternal Substance Abuse:  No Significant Maternal Medications:  None Significant Maternal Lab Results:  None Other Comments:  None  ROS History Dilation: 2.5 Effacement (%): 80 Station: -2 Exam by:: B Mosca RN Blood pressure 134/80, pulse 86, temperature 97.7 F (36.5 C), resp. rate 18, height 5\' 8"  (1.727 m), weight 95.3 kg (210 lb), last menstrual period 11/02/2016. Exam Physical Exam  Prenatal labs: ABO, Rh: O/Positive/-- (06/26 0000) Antibody: Negative (06/26 0000) Rubella: Immune (06/26 0000) RPR: Nonreactive (06/26 0000)  HBsAg: Negative (06/26 0000)  HIV: Non-reactive (06/26 0000)  GBS:     Assessment/Plan: 31 yo G4P3003 @ 38.3wga presenting in labor for rCS. She has a h/o CS x3. She is a JW and declines all blood products even in life threatening situations. Declination of  blood products including albumin consent was signed and she understands the implications including permanent disability and death. Risks discussed including infection, bleeding, damage to surrounding structures, need for additional procedures, and hysterectomy. All questions answered. Consent signed. Proceed with above surgery.  Ancef 2g on call to OR.  Belva AgeeElise Wyatte Dames MD   Madelaine EtienneElise Jennifer Endora Teresi 07/29/2017, 10:13 PM

## 2017-07-29 NOTE — Progress Notes (Signed)
Recheck pt in one hour 

## 2017-07-29 NOTE — Transfer of Care (Signed)
Immediate Anesthesia Transfer of Care Note  Patient: Alexandria Cherry  Procedure(s) Performed: CESAREAN SECTION (N/A )  Patient Location: PACU  Anesthesia Type:Spinal  Level of Consciousness: awake, alert  and oriented  Airway & Oxygen Therapy: Patient Spontanous Breathing  Post-op Assessment: Report given to RN and Post -op Vital signs reviewed and stable  Post vital signs: Reviewed and stable HR 70, RR 18, SaO2 97%, BP 120/80  Last Vitals:  Vitals:   07/29/17 2217 07/29/17 2218  BP: 128/75   Pulse: 68   Resp: 18   Temp: 37.1 C   SpO2:  100%    Last Pain:  Vitals:   07/29/17 2013  PainSc: 6          Complications: No apparent anesthesia complications

## 2017-07-29 NOTE — Anesthesia Preprocedure Evaluation (Signed)
Anesthesia Evaluation  Patient identified by MRN, date of birth, ID band Patient awake    Reviewed: Allergy & Precautions, H&P , NPO status , Patient's Chart, lab work & pertinent test results, reviewed documented beta blocker date and time   History of Anesthesia Complications Negative for: history of anesthetic complications  Airway Mallampati: II  TM Distance: >3 FB Neck ROM: full    Dental  (+) Teeth Intact   Pulmonary neg pulmonary ROS,    breath sounds clear to auscultation       Cardiovascular negative cardio ROS   Rhythm:regular Rate:Normal     Neuro/Psych negative neurological ROS  negative psych ROS   GI/Hepatic negative GI ROS, Neg liver ROS,   Endo/Other  negative endocrine ROS  Renal/GU negative Renal ROS  negative genitourinary   Musculoskeletal   Abdominal   Peds  Hematology  (+) REFUSES BLOOD PRODUCTS,   Anesthesia Other Findings Ate spoonful of yogurt at 0730, last full meal 8:00 pm  CBC pending  Reproductive/Obstetrics (+) Pregnancy (h/o c/s x2, in labor for repeat c/s)                             Anesthesia Physical  Anesthesia Plan  ASA: II and emergent  Anesthesia Plan: Spinal   Post-op Pain Management:    Induction:   PONV Risk Score and Plan: 3 and Ondansetron and Scopolamine patch - Pre-op  Airway Management Planned:   Additional Equipment:   Intra-op Plan:   Post-operative Plan:   Informed Consent: I have reviewed the patients History and Physical, chart, labs and discussed the procedure including the risks, benefits and alternatives for the proposed anesthesia with the patient or authorized representative who has indicated his/her understanding and acceptance.     Plan Discussed with: Surgeon and CRNA  Anesthesia Plan Comments:         Anesthesia Quick Evaluation

## 2017-07-30 ENCOUNTER — Encounter (HOSPITAL_COMMUNITY): Payer: Self-pay | Admitting: Obstetrics and Gynecology

## 2017-07-30 DIAGNOSIS — Z98891 History of uterine scar from previous surgery: Secondary | ICD-10-CM

## 2017-07-30 LAB — CBC
HCT: 33.3 % — ABNORMAL LOW (ref 36.0–46.0)
Hemoglobin: 11 g/dL — ABNORMAL LOW (ref 12.0–15.0)
MCH: 29.2 pg (ref 26.0–34.0)
MCHC: 33 g/dL (ref 30.0–36.0)
MCV: 88.3 fL (ref 78.0–100.0)
PLATELETS: 261 10*3/uL (ref 150–400)
RBC: 3.77 MIL/uL — AB (ref 3.87–5.11)
RDW: 14.4 % (ref 11.5–15.5)
WBC: 13.6 10*3/uL — AB (ref 4.0–10.5)

## 2017-07-30 LAB — ABO/RH: ABO/RH(D): O POS

## 2017-07-30 LAB — NO BLOOD PRODUCTS

## 2017-07-30 LAB — RPR: RPR: NONREACTIVE

## 2017-07-30 MED ORDER — HYDROMORPHONE HCL 1 MG/ML IJ SOLN
INTRAMUSCULAR | Status: AC
  Filled 2017-07-30: qty 0.5

## 2017-07-30 MED ORDER — ZOLPIDEM TARTRATE 5 MG PO TABS: 5.0000 mg | ORAL_TABLET | Freq: Every evening | ORAL | Status: DC | PRN

## 2017-07-30 MED ORDER — IBUPROFEN 600 MG PO TABS
600.0000 mg | ORAL_TABLET | Freq: Four times a day (QID) | ORAL | Status: DC
  Administered 2017-08-01: 600 mg via ORAL
  Filled 2017-07-30 (×4): qty 1

## 2017-07-30 MED ORDER — COCONUT OIL OIL: 1.0000 "application " | TOPICAL_OIL | Status: DC | PRN

## 2017-07-30 MED ORDER — MENTHOL 3 MG MT LOZG: 1.0000 | LOZENGE | OROMUCOSAL | Status: DC | PRN

## 2017-07-30 MED ORDER — TETANUS-DIPHTH-ACELL PERTUSSIS 5-2.5-18.5 LF-MCG/0.5 IM SUSP: 0.5000 mL | Freq: Once | INTRAMUSCULAR | Status: DC

## 2017-07-30 MED ORDER — NALBUPHINE HCL 10 MG/ML IJ SOLN: 5.0000 mg | Freq: Once | INTRAMUSCULAR | Status: DC | PRN

## 2017-07-30 MED ORDER — ACETAMINOPHEN 325 MG PO TABS
650.0000 mg | ORAL_TABLET | ORAL | Status: DC | PRN
  Administered 2017-08-01: 650 mg via ORAL
  Filled 2017-07-30: qty 2

## 2017-07-30 MED ORDER — WITCH HAZEL-GLYCERIN EX PADS: 1.0000 "application " | MEDICATED_PAD | CUTANEOUS | Status: DC | PRN

## 2017-07-30 MED ORDER — SIMETHICONE 80 MG PO CHEW
80.0000 mg | CHEWABLE_TABLET | Freq: Three times a day (TID) | ORAL | Status: DC
  Administered 2017-07-30 – 2017-08-01 (×4): 80 mg via ORAL
  Filled 2017-07-30 (×5): qty 1

## 2017-07-30 MED ORDER — DIPHENHYDRAMINE HCL 25 MG PO CAPS: 25.0000 mg | ORAL_CAPSULE | Freq: Four times a day (QID) | ORAL | Status: DC | PRN

## 2017-07-30 MED ORDER — OXYCODONE HCL 5 MG PO TABS
5.0000 mg | ORAL_TABLET | ORAL | Status: DC | PRN
  Administered 2017-07-31 – 2017-08-01 (×4): 5 mg via ORAL
  Filled 2017-07-30 (×4): qty 1

## 2017-07-30 MED ORDER — NALOXONE HCL 0.4 MG/ML IJ SOLN
1.0000 ug/kg/h | INTRAVENOUS | Status: DC | PRN
  Filled 2017-07-30: qty 5

## 2017-07-30 MED ORDER — OXYCODONE HCL 5 MG PO TABS: 10.0000 mg | ORAL_TABLET | ORAL | Status: DC | PRN

## 2017-07-30 MED ORDER — ACETAMINOPHEN 160 MG/5ML PO SOLN
650.0000 mg | ORAL | Status: DC | PRN
  Administered 2017-07-30 – 2017-07-31 (×3): 650 mg via ORAL
  Filled 2017-07-30 (×3): qty 20.3

## 2017-07-30 MED ORDER — DIPHENHYDRAMINE HCL 50 MG/ML IJ SOLN: 12.5000 mg | INTRAMUSCULAR | Status: DC | PRN

## 2017-07-30 MED ORDER — SCOPOLAMINE 1 MG/3DAYS TD PT72
1.0000 | MEDICATED_PATCH | Freq: Once | TRANSDERMAL | Status: DC
  Administered 2017-07-30: 1.5 mg via TRANSDERMAL
  Filled 2017-07-30 (×2): qty 1

## 2017-07-30 MED ORDER — PRENATAL MULTIVITAMIN CH
1.0000 | ORAL_TABLET | Freq: Every day | ORAL | Status: DC
  Filled 2017-07-30: qty 1

## 2017-07-30 MED ORDER — DIBUCAINE 1 % RE OINT: 1.0000 "application " | TOPICAL_OINTMENT | RECTAL | Status: DC | PRN

## 2017-07-30 MED ORDER — SODIUM CHLORIDE 0.9% FLUSH: 3.0000 mL | INTRAVENOUS | Status: DC | PRN

## 2017-07-30 MED ORDER — SIMETHICONE 80 MG PO CHEW
80.0000 mg | CHEWABLE_TABLET | ORAL | Status: DC | PRN
  Administered 2017-07-31: 80 mg via ORAL

## 2017-07-30 MED ORDER — HYDROMORPHONE HCL 1 MG/ML IJ SOLN
INTRAMUSCULAR | Status: AC
  Administered 2017-07-30: 0.5 mg via INTRAVENOUS
  Filled 2017-07-30: qty 0.5

## 2017-07-30 MED ORDER — DIPHENHYDRAMINE HCL 25 MG PO CAPS: 25.0000 mg | ORAL_CAPSULE | ORAL | Status: DC | PRN

## 2017-07-30 MED ORDER — OXYTOCIN 40 UNITS IN LACTATED RINGERS INFUSION - SIMPLE MED: 2.5000 [IU]/h | INTRAVENOUS | Status: AC

## 2017-07-30 MED ORDER — SENNOSIDES-DOCUSATE SODIUM 8.6-50 MG PO TABS
2.0000 | ORAL_TABLET | ORAL | Status: DC
  Administered 2017-07-30 – 2017-07-31 (×2): 2 via ORAL
  Filled 2017-07-30 (×3): qty 2

## 2017-07-30 MED ORDER — IBUPROFEN 100 MG/5ML PO SUSP
600.0000 mg | Freq: Four times a day (QID) | ORAL | Status: DC
  Administered 2017-07-30 (×2): 600 mg via ORAL
  Administered 2017-07-30: 20 mg via ORAL
  Administered 2017-07-31 (×2): 600 mg via ORAL
  Administered 2017-07-31: 20 mg via ORAL
  Administered 2017-08-01: 600 mg via ORAL
  Filled 2017-07-30 (×11): qty 30

## 2017-07-30 MED ORDER — NALBUPHINE HCL 10 MG/ML IJ SOLN: 5.0000 mg | INTRAMUSCULAR | Status: DC | PRN

## 2017-07-30 MED ORDER — SIMETHICONE 80 MG PO CHEW
80.0000 mg | CHEWABLE_TABLET | ORAL | Status: DC
  Administered 2017-07-30 – 2017-07-31 (×2): 80 mg via ORAL
  Filled 2017-07-30 (×2): qty 1

## 2017-07-30 MED ORDER — NALOXONE HCL 0.4 MG/ML IJ SOLN
0.4000 mg | INTRAMUSCULAR | Status: DC | PRN
Start: 2017-07-30 — End: 2017-08-01

## 2017-07-30 MED ORDER — ONDANSETRON HCL 4 MG/2ML IJ SOLN
4.0000 mg | Freq: Three times a day (TID) | INTRAMUSCULAR | Status: DC | PRN
  Administered 2017-07-30: 4 mg via INTRAVENOUS
  Filled 2017-07-30: qty 2

## 2017-07-30 MED ORDER — LACTATED RINGERS IV SOLN
INTRAVENOUS | Status: DC
  Administered 2017-07-30 (×2): via INTRAVENOUS

## 2017-07-30 NOTE — Anesthesia Postprocedure Evaluation (Signed)
Anesthesia Post Note  Patient: Alexandria Cherry  Procedure(s) Performed: CESAREAN SECTION (N/A )     Patient location during evaluation: Mother Baby Anesthesia Type: Spinal Level of consciousness: awake and alert and oriented Pain management: satisfactory to patient Vital Signs Assessment: post-procedure vital signs reviewed and stable Respiratory status: spontaneous breathing and nonlabored ventilation Cardiovascular status: stable Postop Assessment: no headache, no backache, patient able to bend at knees, no signs of nausea or vomiting and adequate PO intake Anesthetic complications: no    Last Vitals:  Vitals:   07/30/17 0317 07/30/17 0417  BP: (!) 101/55 95/60  Pulse: (!) 56 (!) 57  Resp: 16 16  Temp: 36.9 C   SpO2: 95% 97%    Last Pain:  Vitals:   07/30/17 0644  TempSrc:   PainSc: 3    Pain Goal:                 Madison HickmanGREGORY,Skyler Dusing

## 2017-07-30 NOTE — Lactation Note (Signed)
This note was copied from a baby's chart. Lactation Consultation Note  Patient Name: Girl Manfred ArchJulieta Lodwick WUJWJ'XToday's Date: 07/30/2017 Reason for consult: Initial assessment;Early term 4737-38.6wks  Visited with P4 Mom, baby 6115 hrs old.  Mom has been exclusively breastfeeding baby, and denies having any difficulty.   Encouraged STS, and feedings at the breast on cue, goal is 8-12 feedings per 24 hrs.    Lactation brochure given to Mom, along with resource handout.  Mom aware of Lactation resources available to her.  Mom encouraged to call for help prn.   Consult Status Consult Status: Follow-up Date: 07/31/17 Follow-up type: In-patient    Judee ClaraSmith, Lela Gell E 07/30/2017, 2:15 PM

## 2017-07-30 NOTE — Anesthesia Postprocedure Evaluation (Signed)
Anesthesia Post Note  Patient: Alexandria Cherry  Procedure(s) Performed: CESAREAN SECTION (N/A )     Anesthesia Post Evaluation  Last Vitals:  Vitals:   07/30/17 0015 07/30/17 0020  BP: 109/69   Pulse: 66 62  Resp: 18 16  Temp: 37.2 C   SpO2: 97% 97%    Last Pain:  Vitals:   07/30/17 0015  TempSrc: Oral  PainSc: 4    Pain Goal:                 Lewie LoronJohn Royden Bulman

## 2017-07-30 NOTE — Progress Notes (Signed)
Subjective: Postpartum Day 1: Cesarean Delivery Patient reports tolerating PO.    Objective: Vital signs in last 24 hours: Temp:  [97.4 F (36.3 C)-99.2 F (37.3 C)] 98 F (36.7 C) (12/28 0840) Pulse Rate:  [56-86] 57 (12/28 0417) Resp:  [8-18] 16 (12/28 0840) BP: (95-134)/(44-80) 95/60 (12/28 0417) SpO2:  [95 %-100 %] 98 % (12/28 0840) Weight:  [210 lb (95.3 kg)] 210 lb (95.3 kg) (12/27 2008)  Physical Exam:  General: alert, cooperative and appears stated age 42Lochia: appropriate Uterine Fundus: firm Incision: healing well, no significant drainage, no dehiscence DVT Evaluation: No evidence of DVT seen on physical exam. Negative Homan's sign. No cords or calf tenderness.  Recent Labs    07/29/17 2155 07/30/17 0535  HGB 11.9* 11.0*  HCT 36.2 33.3*    Assessment/Plan: Status post Cesarean section. Doing well postoperatively.  Continue current care.  Alexandria Cherry 07/30/2017, 9:55 AM

## 2017-07-31 NOTE — Lactation Note (Signed)
This note was copied from a baby's chart. Lactation Consultation Note  Patient Name: Alexandria Cherry ZOXWR'UToday's Date: 07/31/2017 Reason for consult: Follow-up assessment Mom states baby is feeding well.  No questions or concerns at this time.  Encouraged to call out for assist or concerns prn.  Maternal Data    Feeding Feeding Type: Breast Fed Length of feed: 15 min  LATCH Score                   Interventions    Lactation Tools Discussed/Used     Consult Status Consult Status: PRN    Huston FoleyMOULDEN, Despina Boan S 07/31/2017, 6:21 PM

## 2017-07-31 NOTE — Progress Notes (Signed)
Subjective: Postpartum Day 2: Cesarean Delivery Patient reports tolerating PO, + flatus and no problems voiding.    Objective: Vital signs in last 24 hours: Temp:  [97.7 F (36.5 C)-98.7 F (37.1 C)] 97.7 F (36.5 C) (12/29 0616) Pulse Rate:  [52-69] 52 (12/29 0616) Resp:  [16-18] 18 (12/29 0616) BP: (93-108)/(45-63) 93/55 (12/29 0616) SpO2:  [96 %-97 %] 97 % (12/28 1830)  Physical Exam:  General: alert, cooperative and appears stated age Lochia: appropriate Uterine Fundus: firm Incision: healing well, no significant drainage, no dehiscence.  Pressure dressing removed.  Honeycomb dressing 50% saturated with old blood. DVT Evaluation: No evidence of DVT seen on physical exam. Negative Homan's sign. No cords or calf tenderness.  Recent Labs    07/29/17 2155 07/30/17 0535  HGB 11.9* 11.0*  HCT 36.2 33.3*    Assessment/Plan: Status post Cesarean section. Doing well postoperatively.  Continue current care. Order written for dressing change.  Takisha Pelle 07/31/2017, 12:08 PM

## 2017-08-01 MED ORDER — IBUPROFEN 600 MG PO TABS: 600.0000 mg | ORAL_TABLET | Freq: Four times a day (QID) | ORAL | 0 refills | Status: AC

## 2017-08-01 MED ORDER — OXYCODONE HCL 5 MG PO TABS: 5.0000 mg | ORAL_TABLET | ORAL | 0 refills | Status: AC | PRN

## 2017-08-01 NOTE — Discharge Summary (Signed)
Obstetric Discharge Summary Reason for Admission: onset of labor Prenatal Procedures: none Intrapartum Procedures: cesarean: low cervical, transverse Postpartum Procedures: none Complications-Operative and Postpartum: none Hemoglobin  Date Value Ref Range Status  07/30/2017 11.0 (L) 12.0 - 15.0 g/dL Final   HCT  Date Value Ref Range Status  07/30/2017 33.3 (L) 36.0 - 46.0 % Final    Physical Exam:  General: alert, cooperative and appears stated age 53Lochia: appropriate Uterine Fundus: firm Incision: healing well, no significant drainage, no dehiscence DVT Evaluation: No evidence of DVT seen on physical exam. Negative Homan's sign. No cords or calf tenderness.  Discharge Diagnoses: Term Pregnancy-delivered  Discharge Information: Date: 08/01/2017 Activity: pelvic rest Diet: routine Medications: PNV, Ibuprofen and Percocet Condition: stable Instructions: refer to practice specific booklet Discharge to: home   Newborn Data: Live born female  Birth Weight: 8 lb 0.9 oz (3655 g) APGAR: 9, 9  Newborn Delivery   Birth date/time:  [birth year]2017 22:58:00 Delivery type:  C-Section, Low Transverse C-section categorization:  Repeat     Home with mother.  Krystyne Tewksbury 08/01/2017, 11:02 AM

## 2017-08-01 NOTE — Lactation Note (Signed)
This note was copied from a baby's chart. Lactation Consultation Note Experienced BF mom states BF going well. Has some soreness, skin intact to nipples. Reviewed Engorgement, filling and LC OP services. Mom has no further questions. Encouraged to cont,. To document I&O until DR. F/u for baby. Call MD if baby's output decreases or baby not wanting to feed.  Patient Name: Alexandria Cherry [id number]'s Date: 08/01/2017 Reason for consult: Follow-up assessment   Maternal Data    Feeding Feeding Type: Breast Fed Length of feed: 20 min  LATCH Score          Comfort (Breast/Nipple): Filling, red/small blisters or bruises, mild/mod discomfort        Interventions    Lactation Tools Discussed/Used     Consult Status Consult Status: Complete Date: 08/01/17    Charyl DancerCARVER, Jishnu Jenniges G 08/01/2017, 7:43 AM

## 2017-08-01 NOTE — Discharge Instructions (Signed)
Call MD for T>100.4, heavy vaginal bleeding, severe abdominal pain, intractable nausea and/or vomiting, or respiratory distress.  Call office to schedule incision check in 1-2 weeks.  No driving while taking narcotics.  Pelvic rest x 6 weeks.   °

## 2017-08-04 ENCOUNTER — Encounter (HOSPITAL_COMMUNITY)
Admission: RE | Admit: 2017-08-04 | Discharge: 2017-08-04 | Disposition: A | Discharge status: Home / Self Care | Payer: 59 | Source: Ambulatory Visit

## 2017-08-04 HISTORY — DX: Unspecified abnormal cytological findings in specimens from vagina: R87.629

## 2017-08-05 ENCOUNTER — Inpatient Hospital Stay (HOSPITAL_COMMUNITY): Admission: AD | Admit: 2017-08-05 | Payer: 59 | Source: Ambulatory Visit | Admitting: Obstetrics and Gynecology
